# Patient Record
Sex: Female | Born: 1990 | Race: Black or African American | Hispanic: No | Marital: Married | Smoking: Current every day smoker
Health system: Southern US, Community
[De-identification: ages and names within clinical notes are randomized; demographics above are authoritative.]

## PROBLEM LIST (undated history)

## (undated) DIAGNOSIS — E119 Type 2 diabetes mellitus without complications: Secondary | ICD-10-CM

## (undated) HISTORY — PX: TUBAL LIGATION: SHX77

## (undated) HISTORY — PX: FOOT SURGERY: SHX648

---

## 2017-01-01 ENCOUNTER — Encounter (HOSPITAL_BASED_OUTPATIENT_CLINIC_OR_DEPARTMENT_OTHER): Payer: Self-pay

## 2017-01-01 ENCOUNTER — Emergency Department (HOSPITAL_BASED_OUTPATIENT_CLINIC_OR_DEPARTMENT_OTHER)
Admission: EM | Admit: 2017-01-01 | Discharge: 2017-01-01 | Disposition: A | Discharge status: Home / Self Care | Payer: Medicaid Other | Attending: Emergency Medicine | Admitting: Emergency Medicine

## 2017-01-01 DIAGNOSIS — K649 Unspecified hemorrhoids: Secondary | ICD-10-CM | POA: Diagnosis not present

## 2017-01-01 DIAGNOSIS — K625 Hemorrhage of anus and rectum: Secondary | ICD-10-CM | POA: Diagnosis present

## 2017-01-01 DIAGNOSIS — F17200 Nicotine dependence, unspecified, uncomplicated: Secondary | ICD-10-CM | POA: Insufficient documentation

## 2017-01-01 LAB — OCCULT BLOOD X 1 CARD TO LAB, STOOL: FECAL OCCULT BLD: POSITIVE — AB

## 2017-01-01 MED ORDER — STARCH 51 % RE SUPP: 1.0000 | RECTAL | 0 refills | Status: DC | PRN

## 2017-01-01 NOTE — ED Provider Notes (Signed)
[facility] Provider Note   CSN: [id number] Arrival date & time: 01/01/17  1445     History   Chief Complaint Chief Complaint  Patient presents with  . Rectal Bleeding    HPI Mary Hurley is a 26 y.o. female presenting with rectal bleeding that she first noticed today around noon. SHe felt he had to have a BM, went to bathroom and peed but could not have a BM- she denies straining. She wiped then noticed BRB in toilet and on tissue paper. She initially thought it was her period but her menstrual cycle ended yesterday. Again 4 hours later she felt like she had to have BM, decided to not pee this time and just see if there was any bleeding. Could not have BM, wiped to see if there was blood and saw bright red blood clots on tissue paper. She decided to come to ED. She is sexually active with her wife, has a BTL, denies any chance she is pregnant. Has not inserted anything into her rectum. No SOB, palpitations, fatigue, DOE. This has never happened to her before. She did have hemorrhoids in the past pregnant w/ her first child but has not noticed them since.   HPI  History reviewed. No pertinent past medical history.  There are no active problems to display for this patient.   Past Surgical History:  Procedure Laterality Date  . CESAREAN SECTION    . TUBAL LIGATION      OB History    No data available       Home Medications    Prior to Admission medications   Not on File    Family History No family history on file.  Social History Social History  Substance Use Topics  . Smoking status: Current Every Day Smoker  . Smokeless tobacco: Never Used  . Alcohol use No     Allergies   Patient has no known allergies.   Review of Systems Review of Systems  Constitutional: Negative for activity change, appetite change, chills and fatigue.  HENT: Negative.   Respiratory: Negative for shortness of breath.   Cardiovascular: Negative for chest pain and  palpitations.  Gastrointestinal: Positive for anal bleeding. Negative for abdominal pain, blood in stool, constipation, diarrhea, nausea, rectal pain and vomiting.  Genitourinary: Negative for dysuria, vaginal bleeding and vaginal pain.  Musculoskeletal: Negative for gait problem and myalgias.  Skin: Negative for pallor, rash and wound.  Neurological: Negative for dizziness, weakness, light-headedness and headaches.     Physical Exam Updated Vital Signs BP 133/84 (BP Location: Left Arm)   Pulse 88   Temp 99 F (37.2 C) (Oral)   Resp 18   Ht  (1.6 m)   Wt 91.5 kg (201 lb 12.8 oz)   LMP 12/25/2016   SpO2 99%   BMI 35.75 kg/m   Physical Exam  Constitutional: She is oriented to person, place, and time. She appears well-developed and well-nourished. No distress.  HENT:  Head: Normocephalic and atraumatic.  Eyes: Pupils are equal, round, and reactive to light. Conjunctivae and EOM are normal.  Neck: Normal range of motion. Neck supple.  Cardiovascular: Normal rate and regular rhythm.   No murmur heard. Pulmonary/Chest: Effort normal and breath sounds normal. No respiratory distress.  Genitourinary: Vagina normal. Rectal exam shows external hemorrhoid, internal hemorrhoid and guaiac positive stool. Rectal exam shows no fissure, no mass and anal tone normal. No bleeding in the vagina. No vaginal discharge found.  Musculoskeletal: Normal range  of motion. She exhibits no tenderness.  Neurological: She is alert and oriented to person, place, and time. She exhibits normal muscle tone.  Skin: Skin is warm and dry. No pallor.  Psychiatric: She has a normal mood and affect. Her behavior is normal. Judgment and thought content normal.   ED Treatments / Results  Labs (all labs ordered are listed, but only abnormal results are displayed) Labs Reviewed  OCCULT BLOOD X 1 CARD TO LAB, STOOL - Abnormal; Notable for the following:       Result Value   Fecal Occult Bld POSITIVE (*)    All  other components within normal limits    EKG  EKG Interpretation None       Radiology No results found.  Procedures Procedures (including critical care time)  Medications Ordered in ED Medications - No data to display   Initial Impression / Assessment and Plan / ED Course  I have reviewed the triage vital signs and the nursing notes.  Pertinent labs & imaging results that were available during my care of the patient were reviewed by me and considered in my medical decision making (see chart for details).     Well appearing 26 year old female presenting with BRB per rectum that began today. She is asymptomatic, no rectal pain. No fissure appreciated on exam but with external hemorrhoids. Tender on rectal exam, no masses appreciated. FOBT positive. No vaginal bleeding appreciated. Likely hemorrhoid bleeding. Advised increasing fiber in diet. Handout given. Patient stable for discharge home. Patient verbalized understanding and agreement with plan.   Final Clinical Impressions(s) / ED Diagnoses   Final diagnoses:  Hemorrhoids, unspecified hemorrhoid type    New Prescriptions New Prescriptions   No medications on file   Dolores Patty, DO PGY-2, [facility] 01/01/2017 3:59 PM    Tillman Sers, DO 01/01/17 1559    Rolland Porter, MD 01/13/17 2213

## 2017-01-01 NOTE — ED Triage Notes (Signed)
C/o rectal bleeding with blood in toilet and on tissue x 2 today-NAD-steady gait

## 2017-01-01 NOTE — ED Notes (Signed)
ED Provider at bedside. 

## 2017-09-15 ENCOUNTER — Encounter (HOSPITAL_BASED_OUTPATIENT_CLINIC_OR_DEPARTMENT_OTHER): Payer: Self-pay | Admitting: *Deleted

## 2017-09-15 ENCOUNTER — Emergency Department (HOSPITAL_BASED_OUTPATIENT_CLINIC_OR_DEPARTMENT_OTHER): Payer: Self-pay

## 2017-09-15 ENCOUNTER — Emergency Department (HOSPITAL_BASED_OUTPATIENT_CLINIC_OR_DEPARTMENT_OTHER)
Admission: EM | Admit: 2017-09-15 | Discharge: 2017-09-15 | Disposition: A | Discharge status: Home / Self Care | Payer: Self-pay | Attending: Emergency Medicine | Admitting: Emergency Medicine

## 2017-09-15 ENCOUNTER — Other Ambulatory Visit: Payer: Self-pay

## 2017-09-15 DIAGNOSIS — S93401A Sprain of unspecified ligament of right ankle, initial encounter: Secondary | ICD-10-CM | POA: Insufficient documentation

## 2017-09-15 DIAGNOSIS — Y9289 Other specified places as the place of occurrence of the external cause: Secondary | ICD-10-CM | POA: Insufficient documentation

## 2017-09-15 DIAGNOSIS — Y939 Activity, unspecified: Secondary | ICD-10-CM | POA: Insufficient documentation

## 2017-09-15 DIAGNOSIS — X501XXA Overexertion from prolonged static or awkward postures, initial encounter: Secondary | ICD-10-CM | POA: Insufficient documentation

## 2017-09-15 DIAGNOSIS — Y999 Unspecified external cause status: Secondary | ICD-10-CM | POA: Insufficient documentation

## 2017-09-15 NOTE — Discharge Instructions (Signed)
Please read and follow all provided instructions.  Your diagnoses today include:  1. Sprain of right ankle, unspecified ligament, initial encounter     Tests performed today include:  An x-ray of your ankle - does NOT show any broken bones  Vital signs. See below for your results today.   Medications prescribed:   None  Take any prescribed medications only as directed.  Home care instructions:   Follow any educational materials contained in this packet  Follow R.I.C.E. Protocol:  R - rest your injury   I  - use ice on injury without applying directly to skin  C - compress injury with bandage or splint  E - elevate the injury as much as possible  Follow-up instructions: Please follow-up with your primary care provider or the provided orthopedic (bone specialist) if you continue to have significant pain or trouble walking in 1 week. In this case you may have a severe sprain that requires further care.   Return instructions:   Please return if your toes are numb or tingling, appear gray or blue, or you have severe pain (also elevate leg and loosen splint or wrap)  Please return to the Emergency Department if you experience worsening symptoms.   Please return if you have any other emergent concerns.  Additional Information:  Your vital signs today were: BP (!) 147/84 (BP Location: Left Arm)    Pulse (!) 102    Temp 98.5 F (36.9 C) (Oral)    Resp 20    Ht 5' 3.75" (1.619 m)    Wt 91.2 kg (201 lb)    LMP 08/28/2017    SpO2 99%    BMI 34.77 kg/m  If your blood pressure (BP) was elevated above 135/85 this visit, please have this repeated by your doctor within one month. -------------- Your caregiver has diagnosed you as suffering from an ankle sprain. Ankle sprain occurs when the ligaments that hold the ankle joint together are stretched or torn. It may take 4 to 6 weeks to heal.  For Activity: If prescribed crutches, use crutches with non-weight bearing for the first  few days. Then, you may walk on your ankle as the pain allows, or as instructed. Start gradually with weight bearing on the affected ankle. Once you can walk pain free, then try jogging. When you can run forwards, then you can try moving side-to-side. If you cannot walk without crutches in one week, you need a re-check. --------------

## 2017-09-15 NOTE — ED Triage Notes (Signed)
Twisted her right ankle at work today.

## 2017-09-15 NOTE — ED Provider Notes (Signed)
[facility] EMERGENCY DEPARTMENT Provider Note   CSN: [id number] Arrival date & time: 09/15/17  2010     History   Chief Complaint Chief Complaint  Patient presents with  . Ankle Injury    HPI Mary Hurley is a 27 y.o. female.  Patient presents to the emergency department with acute onset of right ankle pain when she twisted it at work today.  She denies other injury.  She tried to use a splint for her ankle but cannot tolerate it.  Pain is worse with bearing weight.  She has no knee or hip pain.  No numbness or tingling.  Ice has helped her pain and swelling.     History reviewed. No pertinent past medical history.  There are no active problems to display for this patient.   Past Surgical History:  Procedure Laterality Date  . CESAREAN SECTION    . TUBAL LIGATION       OB History   None      Home Medications    Prior to Admission medications   Medication Sig Start Date End Date Taking? Authorizing Provider  starch (ANUSOL) 51 % suppository Place 1 suppository rectally as needed for pain. 01/01/17   Rolland PorterJames, Mark, MD    Family History No family history on file.  Social History Social History   Tobacco Use  . Smoking status: Current Every Day Smoker  . Smokeless tobacco: Never Used  Substance Use Topics  . Alcohol use: No  . Drug use: No     Allergies   Patient has no known allergies.   Review of Systems Review of Systems  Constitutional: Negative for activity change.  Musculoskeletal: Positive for arthralgias, gait problem and joint swelling. Negative for back pain and neck pain.  Skin: Negative for wound.  Neurological: Negative for weakness and numbness.     Physical Exam Updated Vital Signs BP (!) 147/84 (BP Location: Left Arm)   Pulse (!) 102   Temp 98.5 F (36.9 C) (Oral)   Resp 20   Ht 5' 3.75" (1.619 m)   Wt 91.2 kg (201 lb)   LMP 08/28/2017   SpO2 99%   BMI 34.77 kg/m   Physical Exam  Constitutional: She appears  well-developed and well-nourished.  HENT:  Head: Normocephalic and atraumatic.  Eyes: Conjunctivae are normal.  Neck: Normal range of motion. Neck supple.  Cardiovascular:  Pulses:      Dorsalis pedis pulses are 2+ on the right side, and 2+ on the left side.       Posterior tibial pulses are 2+ on the right side, and 2+ on the left side.  Musculoskeletal: She exhibits edema and tenderness.       Right knee: Normal.       Right ankle: She exhibits decreased range of motion and swelling. Tenderness. Lateral malleolus and medial malleolus tenderness found. Achilles tendon exhibits no pain.       Right lower leg: Normal. She exhibits no tenderness, no bony tenderness and no swelling.       Right foot: Normal. There is normal range of motion and no tenderness.  Neurological: She is alert.  Distal motor, sensation, and vascular intact.   Skin: Skin is warm and dry.  Psychiatric: She has a normal mood and affect.  Nursing note and vitals reviewed.    ED Treatments / Results  Labs (all labs ordered are listed, but only abnormal results are displayed) Labs Reviewed - No data to display  EKG None  Radiology Dg Ankle Complete Right  Result Date: 09/15/2017 CLINICAL DATA:  Twisting injury with diffuse pain. EXAM: RIGHT ANKLE - COMPLETE 3+ VIEW COMPARISON:  None. FINDINGS: There may be minimal lateral malleolar soft tissue swelling. No acute fracture or dislocation. Base of fifth metatarsal and talar dome intact. IMPRESSION: No acute osseous abnormality. Electronically Signed   By: Jeronimo Greaves M.D.   On: 09/15/2017 20:48    Procedures Procedures (including critical care time)  Medications Ordered in ED Medications - No data to display   Initial Impression / Assessment and Plan / ED Course  I have reviewed the triage vital signs and the nursing notes.  Pertinent labs & imaging results that were available during my care of the patient were reviewed by me and considered in my medical  decision making (see chart for details).     Patient seen and examined.  Patient informed of x-ray results.  Ace wrap and crutches ordered.  Vital signs reviewed and are as follows: BP (!) 147/84 (BP Location: Left Arm)   Pulse (!) 102   Temp 98.5 F (36.9 C) (Oral)   Resp 20   Ht 5' 3.75" (1.619 m)   Wt 91.2 kg (201 lb)   LMP 08/28/2017   SpO2 99%   BMI 34.77 kg/m   Patient was counseled on RICE protocol and told to rest injury, use ice for no longer than 15 minutes every hour, compress the area, and elevate above the level of their heart as much as possible to reduce swelling. Questions answered. Patient verbalized understanding.    Encouraged PCP or sports medicine follow-up in 1 week if not improving.   Final Clinical Impressions(s) / ED Diagnoses   Final diagnoses:  Sprain of right ankle, unspecified ligament, initial encounter   Patient with ankle sprain, negative imaging.  Foot is neurovascularly intact.  No proximal fibula pain.  ED Discharge Orders    None       Renne Crigler, Cordelia Poche 09/15/17 2129    Maia Plan, MD 09/16/17 1004

## 2017-10-15 ENCOUNTER — Other Ambulatory Visit: Payer: Self-pay

## 2017-10-15 ENCOUNTER — Emergency Department (HOSPITAL_BASED_OUTPATIENT_CLINIC_OR_DEPARTMENT_OTHER)
Admission: EM | Admit: 2017-10-15 | Discharge: 2017-10-15 | Disposition: A | Discharge status: Home / Self Care | Payer: Medicaid Other | Attending: Emergency Medicine | Admitting: Emergency Medicine

## 2017-10-15 ENCOUNTER — Encounter (HOSPITAL_BASED_OUTPATIENT_CLINIC_OR_DEPARTMENT_OTHER): Payer: Self-pay

## 2017-10-15 DIAGNOSIS — F17200 Nicotine dependence, unspecified, uncomplicated: Secondary | ICD-10-CM | POA: Insufficient documentation

## 2017-10-15 DIAGNOSIS — R51 Headache: Secondary | ICD-10-CM | POA: Diagnosis not present

## 2017-10-15 DIAGNOSIS — J020 Streptococcal pharyngitis: Secondary | ICD-10-CM | POA: Diagnosis not present

## 2017-10-15 DIAGNOSIS — R070 Pain in throat: Secondary | ICD-10-CM | POA: Diagnosis present

## 2017-10-15 DIAGNOSIS — R509 Fever, unspecified: Secondary | ICD-10-CM | POA: Insufficient documentation

## 2017-10-15 LAB — RAPID STREP SCREEN (MED CTR MEBANE ONLY): Streptococcus, Group A Screen (Direct): POSITIVE — AB

## 2017-10-15 MED ORDER — IBUPROFEN 100 MG/5ML PO SUSP
600.0000 mg | Freq: Once | ORAL | Status: AC
  Administered 2017-10-15: 600 mg via ORAL

## 2017-10-15 MED ORDER — ACETAMINOPHEN 160 MG/5ML PO SOLN
650.0000 mg | Freq: Once | ORAL | Status: AC
  Administered 2017-10-15: 650 mg via ORAL

## 2017-10-15 MED ORDER — IBUPROFEN 400 MG PO TABS: 600.0000 mg | ORAL_TABLET | Freq: Once | ORAL | Status: DC

## 2017-10-15 MED ORDER — ACETAMINOPHEN 160 MG/5ML PO SOLN
ORAL | Status: AC
  Filled 2017-10-15: qty 20.3

## 2017-10-15 MED ORDER — IBUPROFEN 100 MG/5ML PO SUSP
ORAL | Status: AC
  Filled 2017-10-15: qty 30

## 2017-10-15 MED ORDER — PENICILLIN G BENZATHINE 1200000 UNIT/2ML IM SUSP
1.2000 10*6.[IU] | Freq: Once | INTRAMUSCULAR | Status: AC
  Administered 2017-10-15: 1.2 10*6.[IU] via INTRAMUSCULAR
  Filled 2017-10-15: qty 2

## 2017-10-15 NOTE — Discharge Instructions (Signed)
You may take over-the-counter ibuprofen and Tylenol as needed for pain.

## 2017-10-15 NOTE — ED Provider Notes (Signed)
[facility] EMERGENCY DEPARTMENT Provider Note   CSN: [id number] Arrival date & time: 10/15/17  1901     History   Chief Complaint Chief Complaint  Patient presents with  . Sore Throat    HPI [name] is a 27 y.o. female.  HPI Patient presents with 3 days of sore throat, subjective fevers and chills, headache.  No voice changes.  No nausea or vomiting.  Denies abdominal pain. History reviewed. No pertinent past medical history.  There are no active problems to display for this patient.   Past Surgical History:  Procedure Laterality Date  . CESAREAN SECTION    . TUBAL LIGATION       OB History   None      Home Medications    Prior to Admission medications   Medication Sig Start Date End Date Taking? Authorizing Provider  starch (ANUSOL) 51 % suppository Place 1 suppository rectally as needed for pain. 01/01/17   Rolland PorterJames, Mark, MD    Family History No family history on file.  Social History Social History   Tobacco Use  . Smoking status: Current Every Day Smoker  . Smokeless tobacco: Never Used  Substance Use Topics  . Alcohol use: No  . Drug use: No     Allergies   Patient has no known allergies.   Review of Systems Review of Systems  Constitutional: Positive for chills, fatigue and fever.  HENT: Positive for sore throat. Negative for congestion, sinus pressure, sinus pain, trouble swallowing and voice change.   Eyes: Negative for visual disturbance.  Respiratory: Negative for cough and shortness of breath.   Cardiovascular: Negative for chest pain, palpitations and leg swelling.  Gastrointestinal: Negative for abdominal pain, diarrhea, nausea and vomiting.  Genitourinary: Negative for flank pain, frequency and hematuria.  Musculoskeletal: Positive for myalgias and neck pain. Negative for arthralgias, back pain and neck stiffness.  Skin: Negative for rash and wound.  Neurological: Positive for headaches. Negative for dizziness,  weakness, light-headedness and numbness.  All other systems reviewed and are negative.    Physical Exam Updated Vital Signs BP 122/88 (BP Location: Left Arm)   Pulse (!) 106   Temp 99.1 F (37.3 C) (Oral)   Resp 20   Ht 5\' 4"  (1.626 m)   Wt 89.8 kg (198 lb)   LMP 09/23/2017   SpO2 98%   BMI 33.99 kg/m   Physical Exam  Constitutional: She is oriented to person, place, and time. She appears well-developed and well-nourished. No distress.  HENT:  Head: Normocephalic and atraumatic.  Mouth/Throat: Oropharyngeal exudate present.  Bilateral tonsillar hypertrophy with erythema and exudates.  Uvula is midline.  Eyes: Pupils are equal, round, and reactive to light. EOM are normal.  Neck: Normal range of motion. Neck supple.  No meningismus.  Anterior cervical lymphadenopathy.  Cardiovascular: Normal rate and regular rhythm.  Pulmonary/Chest: Effort normal and breath sounds normal.  Abdominal: Soft. Bowel sounds are normal. There is no tenderness. There is no rebound and no guarding.  Musculoskeletal: Normal range of motion. She exhibits no edema or tenderness.  Lymphadenopathy:    She has cervical adenopathy.  Neurological: She is alert and oriented to person, place, and time.  Skin: Skin is warm and dry. Capillary refill takes less than 2 seconds. No rash noted. She is not diaphoretic. No erythema.  Psychiatric: She has a normal mood and affect. Her behavior is normal.  Nursing note and vitals reviewed.    ED Treatments / Results  Labs (  all labs ordered are listed, but only abnormal results are displayed) Labs Reviewed  RAPID STREP SCREEN ([facility] [place] ONLY) - Abnormal; Notable for the following components:      Result Value   Streptococcus, Group A Screen (Direct) POSITIVE (*)    All other components within normal limits    EKG None  Radiology No results found.  Procedures Procedures (including critical care time)  Medications Ordered in ED Medications    acetaminophen (TYLENOL) 160 MG/5ML solution (has no administration in time range)  ibuprofen (ADVIL,MOTRIN) 100 MG/5ML suspension (has no administration in time range)  acetaminophen (TYLENOL) solution 650 mg (650 mg Oral Given 10/15/17 1931)  penicillin g benzathine (BICILLIN LA) 1200000 UNIT/2ML injection 1.2 Million Units (1.2 Million Units Intramuscular Given 10/15/17 2007)  ibuprofen (ADVIL,MOTRIN) 100 MG/5ML suspension 600 mg (600 mg Oral Given 10/15/17 2007)     Initial Impression / Assessment and Plan / ED Course  I have reviewed the triage vital signs and the nursing notes.  Pertinent labs & imaging results that were available during my care of the patient were reviewed by me and considered in my medical decision making (see chart for details).     Given IM penicillin and will discharge home with ibuprofen.  Return precautions have been given.  Final Clinical Impressions(s) / ED Diagnoses   Final diagnoses:  Strep pharyngitis    ED Discharge Orders    None       Loren Racer, MD 10/15/17 2015

## 2017-10-15 NOTE — ED Notes (Signed)
ED Provider at bedside. 

## 2017-10-15 NOTE — ED Triage Notes (Signed)
C/o sore throat, fever x 3 days-NAD-steady gait-states she needs RTW note-NAD-steady gait

## 2018-01-28 ENCOUNTER — Emergency Department (HOSPITAL_BASED_OUTPATIENT_CLINIC_OR_DEPARTMENT_OTHER)
Admission: EM | Admit: 2018-01-28 | Discharge: 2018-01-28 | Disposition: A | Discharge status: Home / Self Care | Payer: Medicaid Other | Attending: Emergency Medicine | Admitting: Emergency Medicine

## 2018-01-28 ENCOUNTER — Other Ambulatory Visit: Payer: Self-pay

## 2018-01-28 ENCOUNTER — Encounter (HOSPITAL_BASED_OUTPATIENT_CLINIC_OR_DEPARTMENT_OTHER): Payer: Self-pay

## 2018-01-28 DIAGNOSIS — J029 Acute pharyngitis, unspecified: Secondary | ICD-10-CM | POA: Insufficient documentation

## 2018-01-28 DIAGNOSIS — F17200 Nicotine dependence, unspecified, uncomplicated: Secondary | ICD-10-CM | POA: Diagnosis not present

## 2018-01-28 MED ORDER — PENICILLIN G BENZATHINE & PROC 1200000 UNIT/2ML IM SUSP
1.2000 10*6.[IU] | Freq: Once | INTRAMUSCULAR | Status: AC
  Administered 2018-01-28: 1.2 10*6.[IU] via INTRAMUSCULAR
  Filled 2018-01-28: qty 2

## 2018-01-28 MED ORDER — ACETAMINOPHEN 500 MG PO TABS
1000.0000 mg | ORAL_TABLET | Freq: Once | ORAL | Status: AC
  Administered 2018-01-28: 1000 mg via ORAL
  Filled 2018-01-28: qty 2

## 2018-01-28 NOTE — ED Provider Notes (Signed)
[facility] EMERGENCY DEPARTMENT Provider Note   CSN: [id number] Arrival date & time: 01/28/18  1301     History   Chief Complaint Chief Complaint  Patient presents with  . URI    HPI Mary Hurley is a 27 y.o. female comes for evaluation of 3 days of sore throat.  She reports she took 1 dose of ibuprofen today with minimal improvement in symptoms.  She states she has been able to tolerate her secretions but does report some worsening pain with swelling.  She has not had any vomiting or difficulty breathing.  Patient states that she is also had a lot of nasal congestion sinus pressure.  She reports that just prior to arrival, she started developing headache.  Patient reports that she had subjective fevers at home but did not measure her temperature.  She reports sick contacts at work.  Patient denies any cough, abdominal pain, difficulty tolerating p.o.  The history is provided by the patient.    History reviewed. No pertinent past medical history.  There are no active problems to display for this patient.   Past Surgical History:  Procedure Laterality Date  . CESAREAN SECTION    . TUBAL LIGATION       OB History   None      Home Medications    Prior to Admission medications   Medication Sig Start Date End Date Taking? Authorizing Provider  starch (ANUSOL) 51 % suppository Place 1 suppository rectally as needed for pain. 01/01/17   Rolland Porter, MD    Family History No family history on file.  Social History Social History   Tobacco Use  . Smoking status: Current Every Day Smoker  . Smokeless tobacco: Never Used  Substance Use Topics  . Alcohol use: No  . Drug use: No     Allergies   Patient has no known allergies.   Review of Systems Review of Systems  Constitutional: Positive for fever (Subjective).  HENT: Positive for congestion, sinus pressure and sore throat. Negative for drooling and trouble swallowing.   Respiratory: Negative for  cough and shortness of breath.   Cardiovascular: Negative for chest pain.  Gastrointestinal: Negative for vomiting.  Genitourinary: Negative for dysuria.  All other systems reviewed and are negative.    Physical Exam Updated Vital Signs BP 129/83 (BP Location: Left Arm)   Pulse (!) 112   Temp 98.7 F (37.1 C) (Oral)   Resp 20   Ht 5\' 3"  (1.6 m)   Wt 84.8 kg   LMP 01/25/2018   SpO2 98%   BMI 33.13 kg/m   Physical Exam  Constitutional: She is oriented to person, place, and time. She appears well-developed and well-nourished.  HENT:  Head: Normocephalic and atraumatic.  Mouth/Throat: Uvula is midline and mucous membranes are normal. No trismus in the jaw. Posterior oropharyngeal erythema present. Tonsillar exudate.  No midline.  No trismus.  Posterior oropharynx is erythematous with tonsillar exudates.  No evidence of peritonsillar swelling.  Airways patent, phonation is intact.  Eyes: Pupils are equal, round, and reactive to light. Conjunctivae, EOM and lids are normal.  Neck: Full passive range of motion without pain.  Cardiovascular: Normal rate, regular rhythm, normal heart sounds and normal pulses.  Pulmonary/Chest: Effort normal and breath sounds normal.  Musculoskeletal: Normal range of motion.  Lymphadenopathy:    She has cervical adenopathy.       Left cervical: Superficial cervical adenopathy present.  Neurological: She is alert and oriented to person, place,  and time.  Skin: Skin is warm and dry. Capillary refill takes less than 2 seconds.  Psychiatric: She has a normal mood and affect. Her speech is normal.  Nursing note and vitals reviewed.    ED Treatments / Results  Labs (all labs ordered are listed, but only abnormal results are displayed) Labs Reviewed - No data to display  EKG None  Radiology No results found.  Procedures Procedures (including critical care time)  Medications Ordered in ED Medications  acetaminophen (TYLENOL) tablet 1,000 mg  (1,000 mg Oral Given 01/28/18 1424)  penicillin g procaine-penicillin g benzathine (BICILLIN-CR) injection 600000-600000 units (1.2 Million Units Intramuscular Given 01/28/18 1426)     Initial Impression / Assessment and Plan / ED Course  I have reviewed the triage vital signs and the nursing notes.  Pertinent labs & imaging results that were available during my care of the patient were reviewed by me and considered in my medical decision making (see chart for details).     27 year old female who presents for evaluation of 3 days of sore throat.  Reports subjective fevers at home.  No difficulty tolerating secretions, vomiting but does report worsening pain with swallowing.  No cough noted. Patient is afebrile, non-toxic appearing, sitting comfortably on examination table. Vital signs reviewed and stable.  On exam, posterior oropharynx is erythematous with tonsillar exudates left, greater than right.  Uvula is midline.  No trismus.  Patient also with cervical lymphadenopathy.  Exam is concerning for strep pharyngitis.  Patient has a Centor score of 3.  We will go ahead and treat for strep pharyngitis.  History/physical exam is not concerning for peritonsillar abscess, Ludwig angina.  Discussed treatment options with patient.  She would prefer penicillin here in ED. Patient with no known drug allergies. Patient had ample opportunity for questions and discussion. All patient's questions were answered with full understanding. Strict return precautions discussed. Patient expresses understanding and agreement to plan.   Final Clinical Impressions(s) / ED Diagnoses   Final diagnoses:  Pharyngitis, unspecified etiology    ED Discharge Orders    None       Maxwell Caul, PA-C 01/28/18 1612    Arby Barrette, MD 02/06/18 1558

## 2018-01-28 NOTE — Discharge Instructions (Signed)
You can take Tylenol or Ibuprofen as directed for pain. You can alternate Tylenol and Ibuprofen every 4 hours. If you take Tylenol at 1pm, then you can take Ibuprofen at 5pm. Then you can take Tylenol again at 9pm.   Make sure you are drinking plenty of fluids and stay hydrated.  Follow-up with your primary care doctor or the referred [facility] for further evaluation or establishing primary care doctor.  Return the emergency department for any worsening sore throat, inability to swallow your saliva, vomiting, difficulty breathing, fever despite medications or any other worsening or concerning symptoms.

## 2018-01-28 NOTE — ED Triage Notes (Signed)
C/o URI sx x 3 days-NAD-steady gait 

## 2018-03-26 ENCOUNTER — Emergency Department (HOSPITAL_BASED_OUTPATIENT_CLINIC_OR_DEPARTMENT_OTHER): Payer: Medicaid Other

## 2018-03-26 ENCOUNTER — Encounter (HOSPITAL_BASED_OUTPATIENT_CLINIC_OR_DEPARTMENT_OTHER): Payer: Self-pay | Admitting: *Deleted

## 2018-03-26 ENCOUNTER — Emergency Department (HOSPITAL_BASED_OUTPATIENT_CLINIC_OR_DEPARTMENT_OTHER)
Admission: EM | Admit: 2018-03-26 | Discharge: 2018-03-26 | Disposition: A | Discharge status: Home / Self Care | Payer: Medicaid Other | Attending: Emergency Medicine | Admitting: Emergency Medicine

## 2018-03-26 ENCOUNTER — Other Ambulatory Visit: Payer: Self-pay

## 2018-03-26 DIAGNOSIS — W230XXA Caught, crushed, jammed, or pinched between moving objects, initial encounter: Secondary | ICD-10-CM | POA: Insufficient documentation

## 2018-03-26 DIAGNOSIS — Y999 Unspecified external cause status: Secondary | ICD-10-CM | POA: Insufficient documentation

## 2018-03-26 DIAGNOSIS — Y939 Activity, unspecified: Secondary | ICD-10-CM | POA: Diagnosis not present

## 2018-03-26 DIAGNOSIS — S67193A Crushing injury of left middle finger, initial encounter: Secondary | ICD-10-CM | POA: Diagnosis present

## 2018-03-26 DIAGNOSIS — F17210 Nicotine dependence, cigarettes, uncomplicated: Secondary | ICD-10-CM | POA: Insufficient documentation

## 2018-03-26 DIAGNOSIS — S6992XA Unspecified injury of left wrist, hand and finger(s), initial encounter: Secondary | ICD-10-CM

## 2018-03-26 DIAGNOSIS — Y92810 Car as the place of occurrence of the external cause: Secondary | ICD-10-CM | POA: Insufficient documentation

## 2018-03-26 MED ORDER — HYDROCODONE-ACETAMINOPHEN 5-325 MG PO TABS
1.0000 | ORAL_TABLET | Freq: Once | ORAL | Status: AC
  Administered 2018-03-26: 1 via ORAL
  Filled 2018-03-26: qty 1

## 2018-03-26 NOTE — ED Triage Notes (Signed)
She shut her hand in a car door. Pain in her left middle finger. States it is the same finger she broke in November.

## 2018-03-26 NOTE — ED Notes (Signed)
Pt. Reports she injured this same finger / broke this same L middle finger back in Nov.  With inability to return to work due to not being able to use both hands.

## 2018-03-26 NOTE — ED Provider Notes (Signed)
[facility] EMERGENCY DEPARTMENT Provider Note   CSN: [id number] Arrival date & time: 03/26/18  2121     History   Chief Complaint Chief Complaint  Patient presents with  . Finger Injury    HPI Mary Hurley is a 27 y.o. female.  The history is provided by the patient and medical records. No language interpreter was used.   Mary Hurley is a 27 y.o. female who presents to ER after slamming middle long left finger in the car door today. Injured this same finger about a month ago - told finger was broken. Followed by Dr. Aundria Rudogers of orthopedics. Has been going to PT for this. Pain to the same area. Worse with certain movements. No numbness, weakness or open wounds.   History reviewed. No pertinent past medical history.  There are no active problems to display for this patient.   Past Surgical History:  Procedure Laterality Date  . CESAREAN SECTION    . TUBAL LIGATION       OB History   No obstetric history on file.      Home Medications    Prior to Admission medications   Medication Sig Start Date End Date Taking? Authorizing Provider  starch (ANUSOL) 51 % suppository Place 1 suppository rectally as needed for pain. 01/01/17   Rolland PorterJames, Mark, MD    Family History No family history on file.  Social History Social History   Tobacco Use  . Smoking status: Current Every Day Smoker  . Smokeless tobacco: Never Used  Substance Use Topics  . Alcohol use: No  . Drug use: No     Allergies   Patient has no known allergies.   Review of Systems Review of Systems  Musculoskeletal: Positive for arthralgias.  Skin: Negative for color change and wound.  Neurological: Negative for weakness and numbness.     Physical Exam Updated Vital Signs BP (!) 144/89 (BP Location: Left Arm)   Pulse 71   Temp 98.5 F (36.9 C) (Oral)   Resp 18   Ht 5\' 3"  (1.6 m)   Wt 83 kg   LMP 02/23/2018   SpO2 100%   BMI 32.42 kg/m   Physical Exam Vitals signs and  nursing note reviewed.  Constitutional:      General: She is not in acute distress.    Appearance: She is well-developed.  HENT:     Head: Normocephalic and atraumatic.  Neck:     Musculoskeletal: Neck supple.  Cardiovascular:     Rate and Rhythm: Normal rate and regular rhythm.     Heart sounds: Normal heart sounds. No murmur.  Pulmonary:     Effort: Pulmonary effort is normal. No respiratory distress.     Breath sounds: Normal breath sounds. No wheezing or rales.  Musculoskeletal: Normal range of motion.     Comments: Tenderness to palpation of left middle finger DIP. No open wounds. Pain with movement of DIP joint. 2+ radial pulse. Sensation intact.   Skin:    General: Skin is warm and dry.  Neurological:     Mental Status: She is alert.      ED Treatments / Results  Labs (all labs ordered are listed, but only abnormal results are displayed) Labs Reviewed - No data to display  EKG None  Radiology Dg Hand Complete Left  Result Date: 03/26/2018 CLINICAL DATA:  Initial evaluation for acute pain status post trauma. EXAM: LEFT HAND - COMPLETE 3+ VIEW COMPARISON:  None. FINDINGS: Fracture seen extending through the  distal tuft of the left third distal phalanx, likely subacute in nature. Minimal displacement. No other acute fracture or dislocation. Joint spaces maintained. No appreciable soft tissue injury. IMPRESSION: 1. Minimally displaced fracture extending through the distal tuft of the left third distal phalanx. Finding favored to be subacute in nature. Correlation with history and physical exam recommended. 2. No other acute osseous abnormality about the hand. Electronically Signed   By: Rise Mu M.D.   On: 03/26/2018 22:16    Procedures Procedures (including critical care time)  Medications Ordered in ED Medications  HYDROcodone-acetaminophen (NORCO/VICODIN) 5-325 MG per tablet 1 tablet (1 tablet Oral Given 03/26/18 2246)     Initial Impression /  Assessment and Plan / ED Course  I have reviewed the triage vital signs and the nursing notes.  Pertinent labs & imaging results that were available during my care of the patient were reviewed by me and considered in my medical decision making (see chart for details).    [name] is a 27 y.o. female who presents to ED for evaluation after slamming finger in car door prior to arrival. NVI with no open wounds. X-ray obtained showing minimally displaced distal tuft fracture. Appears subacute per radiology. She did injury this same area last month. Possible that it is subacute, but given new injury, tenderness and pain, will place in finger splint and have her follow up with Dr. Aundria Rud who is managing her previous injury. Symptomatic home care instructions and return precautions discussed. All questions answered.   Final Clinical Impressions(s) / ED Diagnoses   Final diagnoses:  Injury of finger of left hand, initial encounter    ED Discharge Orders    None       Ward, Chase Picket, PA-C 03/26/18 2352    Little, Ambrose Finland, MD 03/28/18 1100

## 2018-03-26 NOTE — Discharge Instructions (Signed)
It was my pleasure taking care of you today!   Keep your finger in the splint provided in the emergency department tonight.  Please call Dr. Aundria Rudogers in the morning to let him know of your reinjury and schedule follow-up appointment.  Return to ER for new or worsening symptoms, any additional concerns.

## 2018-05-09 ENCOUNTER — Other Ambulatory Visit: Payer: Self-pay

## 2018-05-09 ENCOUNTER — Encounter (HOSPITAL_BASED_OUTPATIENT_CLINIC_OR_DEPARTMENT_OTHER): Payer: Self-pay | Admitting: Emergency Medicine

## 2018-05-09 ENCOUNTER — Emergency Department (HOSPITAL_BASED_OUTPATIENT_CLINIC_OR_DEPARTMENT_OTHER)
Admission: EM | Admit: 2018-05-09 | Discharge: 2018-05-09 | Disposition: A | Discharge status: Home / Self Care | Payer: Medicaid Other | Attending: Emergency Medicine | Admitting: Emergency Medicine

## 2018-05-09 DIAGNOSIS — F17210 Nicotine dependence, cigarettes, uncomplicated: Secondary | ICD-10-CM | POA: Insufficient documentation

## 2018-05-09 DIAGNOSIS — B354 Tinea corporis: Secondary | ICD-10-CM | POA: Insufficient documentation

## 2018-05-09 DIAGNOSIS — R21 Rash and other nonspecific skin eruption: Secondary | ICD-10-CM | POA: Diagnosis present

## 2018-05-09 MED ORDER — CLOTRIMAZOLE 1 % EX CREA: TOPICAL_CREAM | CUTANEOUS | 0 refills | Status: DC

## 2018-05-09 NOTE — ED Notes (Signed)
ED Provider at bedside. 

## 2018-05-09 NOTE — ED Triage Notes (Signed)
Pt states she has rash on right forearm that is itchy. Denies fevers or other symptoms

## 2018-05-09 NOTE — ED Notes (Addendum)
Rash with redness x 3 days reported by pt. Pt presents with rash and redness to right inner arm. Pt states she has been using A&D ointment for itching with no relief. Pt has broken skin r/t scratching the rash

## 2018-05-18 NOTE — ED Provider Notes (Signed)
[facility] EMERGENCY DEPARTMENT Provider Note   CSN: [id number] Arrival date & time: 05/09/18  1123     History   Chief Complaint Chief Complaint  Patient presents with  . Rash    HPI Mary Hurley is a 28 y.o. female.  HPI   28 year old female with rash.  Onset 3 days ago.  Itches.  Rashes to right upper extremity.  Her mother tried putting bleach on it which is made it burn.  She is also tried A&E ointment without improvement.  No respiratory complaints.  No GI complaints.  No dizziness or lightheadedness.  No new exposures that she is aware of.  History reviewed. No pertinent past medical history.  There are no active problems to display for this patient.   Past Surgical History:  Procedure Laterality Date  . CESAREAN SECTION    . TUBAL LIGATION       OB History   No obstetric history on file.      Home Medications    Prior to Admission medications   Medication Sig Start Date End Date Taking? Authorizing Provider  clotrimazole (LOTRIMIN) 1 % cream Apply to affected area 2 times daily 05/09/18   Raeford Razor, MD  starch (ANUSOL) 51 % suppository Place 1 suppository rectally as needed for pain. 01/01/17   Rolland Porter, MD    Family History No family history on file.  Social History Social History   Tobacco Use  . Smoking status: Current Every Day Smoker  . Smokeless tobacco: Never Used  Substance Use Topics  . Alcohol use: No  . Drug use: No     Allergies   Patient has no known allergies.   Review of Systems Review of Systems  All systems reviewed and negative, other than as noted in HPI.  Physical Exam Updated Vital Signs BP 114/86 (BP Location: Right Arm)   Pulse 96   Temp 98.1 F (36.7 C) (Oral)   Resp 20   Ht 5\' 3"  (1.6 m)   Wt 83 kg   SpO2 100%   BMI 32.42 kg/m   Physical Exam Vitals signs and nursing note reviewed.  Constitutional:      General: She is not in acute distress.    Appearance: She is well-developed.   HENT:     Head: Normocephalic and atraumatic.  Eyes:     General:        Right eye: No discharge.        Left eye: No discharge.     Conjunctiva/sclera: Conjunctivae normal.  Neck:     Musculoskeletal: Neck supple.  Cardiovascular:     Rate and Rhythm: Normal rate and regular rhythm.     Heart sounds: Normal heart sounds. No murmur. No friction rub. No gallop.   Pulmonary:     Effort: Pulmonary effort is normal. No respiratory distress.     Breath sounds: Normal breath sounds.  Abdominal:     General: There is no distension.     Palpations: Abdomen is soft.     Tenderness: There is no abdominal tenderness.  Musculoskeletal:        General: No tenderness.  Skin:    General: Skin is warm and dry.     Findings: Rash present.     Comments: Upper extremity there is approximately 3 cm circular to ovoid lesion.  Excoriated.  Slightly raised borders.  No drainage.  No abscess.  Neurological:     Mental Status: She is alert.  Psychiatric:  Behavior: Behavior normal.        Thought Content: Thought content normal.      ED Treatments / Results  Labs (all labs ordered are listed, but only abnormal results are displayed) Labs Reviewed - No data to display  EKG None  Radiology No results found.  Procedures Procedures (including critical care time)  Medications Ordered in ED Medications - No data to display   Initial Impression / Assessment and Plan / ED Course  I have reviewed the triage vital signs and the nursing notes.  Pertinent labs & imaging results that were available during my care of the patient were reviewed by me and considered in my medical decision making (see chart for details).     28 year old female with rash to her right arm.  I suspect this may be ringworm.  No other lesions.  No systemic symptoms.  Topical antifungals.  Return precautions were discussed. Final Clinical Impressions(s) / ED Diagnoses   Final diagnoses:  Tinea corporis    ED  Discharge Orders         Ordered    clotrimazole (LOTRIMIN) 1 % cream  Status:  Discontinued     05/09/18 1201    clotrimazole (LOTRIMIN) 1 % cream     05/09/18 1210           Raeford Razor, MD 05/18/18 1545

## 2018-05-26 ENCOUNTER — Encounter (HOSPITAL_BASED_OUTPATIENT_CLINIC_OR_DEPARTMENT_OTHER): Payer: Self-pay | Admitting: *Deleted

## 2018-05-26 ENCOUNTER — Emergency Department (HOSPITAL_BASED_OUTPATIENT_CLINIC_OR_DEPARTMENT_OTHER)
Admission: EM | Admit: 2018-05-26 | Discharge: 2018-05-26 | Disposition: A | Discharge status: Home / Self Care | Payer: Medicaid Other | Attending: Emergency Medicine | Admitting: Emergency Medicine

## 2018-05-26 ENCOUNTER — Other Ambulatory Visit: Payer: Self-pay

## 2018-05-26 DIAGNOSIS — L03311 Cellulitis of abdominal wall: Secondary | ICD-10-CM | POA: Insufficient documentation

## 2018-05-26 DIAGNOSIS — F17200 Nicotine dependence, unspecified, uncomplicated: Secondary | ICD-10-CM | POA: Diagnosis not present

## 2018-05-26 DIAGNOSIS — R222 Localized swelling, mass and lump, trunk: Secondary | ICD-10-CM | POA: Diagnosis present

## 2018-05-26 DIAGNOSIS — L02211 Cutaneous abscess of abdominal wall: Secondary | ICD-10-CM | POA: Diagnosis not present

## 2018-05-26 DIAGNOSIS — L0291 Cutaneous abscess, unspecified: Secondary | ICD-10-CM

## 2018-05-26 MED ORDER — DOXYCYCLINE HYCLATE 100 MG PO CAPS: 100.0000 mg | ORAL_CAPSULE | Freq: Two times a day (BID) | ORAL | 0 refills | Status: AC

## 2018-05-26 MED ORDER — LIDOCAINE-EPINEPHRINE (PF) 2 %-1:200000 IJ SOLN
INTRAMUSCULAR | Status: AC
  Administered 2018-05-26: 10 mL
  Filled 2018-05-26: qty 10

## 2018-05-26 NOTE — ED Notes (Signed)
ED Provider at bedside. 

## 2018-05-26 NOTE — ED Triage Notes (Signed)
Swelling, redness and pain to her left lower abdomen. States she thinks it is an abscess like she recently had on the center of her abdomen.

## 2018-05-26 NOTE — Discharge Instructions (Signed)
Take Tylenol 1000 mg 4 times a day for 1 week. This is the maximum dose of Tylenol you can take from all sources. Please check other over-the-counter medications and prescriptions to ensure you are not taking other medications that contain acetaminophen.  You may also take ibuprofen 400 mg 6 times a day alternating with or at the same time as tylenol.

## 2018-05-26 NOTE — ED Provider Notes (Signed)
[facility] EMERGENCY DEPARTMENT Provider Note   CSN: [id number] Arrival date & time: 05/26/18  1601     History   Chief Complaint Chief Complaint  Patient presents with  . Abscess    HPI Mary Hurley is a 28 y.o. female.  HPI   3 days of pain, redness to left lower abdomen. Recently had periumbilical abscess that drained itself, however after that she developed increasing redness, swelling and pain to the left of it and now has area about 10cm of redness to left lower abdomen. No known fevers, no vomiting. No other medical problems. Has otherwise not had hx of abscess drainage.   History reviewed. No pertinent past medical history.  There are no active problems to display for this patient.   Past Surgical History:  Procedure Laterality Date  . CESAREAN SECTION    . TUBAL LIGATION       OB History   No obstetric history on file.      Home Medications    Prior to Admission medications   Medication Sig Start Date End Date Taking? Authorizing Provider  clotrimazole (LOTRIMIN) 1 % cream Apply to affected area 2 times daily 05/09/18   Raeford Razor, MD  doxycycline (VIBRAMYCIN) 100 MG capsule Take 1 capsule (100 mg total) by mouth 2 (two) times daily for 7 days. 05/26/18 06/02/18  Alvira Monday, MD  starch (ANUSOL) 51 % suppository Place 1 suppository rectally as needed for pain. 01/01/17   Rolland Porter, MD    Family History No family history on file.  Social History Social History   Tobacco Use  . Smoking status: Current Every Day Smoker  . Smokeless tobacco: Never Used  Substance Use Topics  . Alcohol use: No  . Drug use: No     Allergies   Patient has no known allergies.   Review of Systems Review of Systems  Constitutional: Negative for chills and fever.  Respiratory: Negative for shortness of breath.   Cardiovascular: Negative for chest pain.  Gastrointestinal: Positive for abdominal pain. Negative for nausea and vomiting.  Skin:  Positive for rash and wound.     Physical Exam Updated Vital Signs BP 98/61   Pulse 96   Temp 98.2 F (36.8 C)   Resp 18   Ht 5\' 3"  (1.6 m)   Wt 88 kg   LMP 05/26/2018   SpO2 100%   BMI 34.37 kg/m   Physical Exam Vitals signs and nursing note reviewed.  Constitutional:      General: She is not in acute distress.    Appearance: She is well-developed. She is not diaphoretic.  HENT:     Head: Normocephalic and atraumatic.  Eyes:     Conjunctiva/sclera: Conjunctivae normal.  Neck:     Musculoskeletal: Normal range of motion.  Cardiovascular:     Rate and Rhythm: Normal rate and regular rhythm.  Pulmonary:     Effort: Pulmonary effort is normal. No respiratory distress.  Abdominal:     General: There is no distension.     Palpations: Abdomen is soft.     Tenderness: There is abdominal tenderness (localized to area of infection). There is no guarding.  Musculoskeletal:        General: No tenderness.  Skin:    General: Skin is warm and dry.     Findings: Rash (healing scar periumbilical, erythema approx 10cm left of umbilicus, induration, central fluctuance and tenderness) present. No erythema.  Neurological:     Mental Status: She is  alert and oriented to person, place, and time.      ED Treatments / Results  Labs (all labs ordered are listed, but only abnormal results are displayed) Labs Reviewed - No data to display  EKG None  Radiology No results found.  Procedures .Marland Kitchen.Incision and Drainage Date/Time: 05/28/2018 11:53 AM Performed by: Alvira MondaySchlossman, Makaila Windle, MD Authorized by: Alvira MondaySchlossman, Ayren Zumbro, MD   Consent:    Consent obtained:  Verbal   Consent given by:  Patient   Risks discussed:  Bleeding, incomplete drainage, infection, pain and damage to other organs   Alternatives discussed:  Referral Location:    Type:  Abscess   Size:  3   Location:  Trunk   Trunk location:  Abdomen Pre-procedure details:    Skin preparation:  Chloraprep Anesthesia (see MAR  for exact dosages):    Anesthesia method:  Local infiltration   Local anesthetic:  Lidocaine 1% WITH epi Procedure type:    Complexity:  Simple Procedure details:    Incision types:  Single straight   Scalpel blade:  11   Wound management:  Probed and deloculated and irrigated with saline   Drainage:  Purulent   Drainage amount:  Copious   Wound treatment:  Wound left open   Packing materials:  1/2 in iodoform gauze Post-procedure details:    Patient tolerance of procedure:  Tolerated well, no immediate complications   (including critical care time)  Medications Ordered in ED Medications  lidocaine-EPINEPHrine (XYLOCAINE W/EPI) 2 %-1:200000 (PF) injection (10 mLs  Given by Other 05/26/18 1938)     Initial Impression / Assessment and Plan / ED Course  I have reviewed the triage vital signs and the nursing notes.  Pertinent labs & imaging results that were available during my care of the patient were reviewed by me and considered in my medical decision making (see chart for details).     28 year old female presents with concern for abdominal wall abscess and cellulitis.  Large area of induration, smaller area of fluctuance. Incision and drainage performed with copious drainage.  No systemic symptoms.  Given rx for antibiotics and discussed reasons to return to ED in detail, risks of needing repeat I/D. Patient discharged in stable condition with understanding of reasons to return.  Final Clinical Impressions(s) / ED Diagnoses   Final diagnoses:  Abscess  Cellulitis of abdominal wall    ED Discharge Orders         Ordered    doxycycline (VIBRAMYCIN) 100 MG capsule  2 times daily     05/26/18 2005           Alvira MondaySchlossman, Nakeem Murnane, MD 05/28/18 1202

## 2018-11-07 ENCOUNTER — Emergency Department (HOSPITAL_BASED_OUTPATIENT_CLINIC_OR_DEPARTMENT_OTHER): Payer: Medicaid Other

## 2018-11-07 ENCOUNTER — Emergency Department (HOSPITAL_BASED_OUTPATIENT_CLINIC_OR_DEPARTMENT_OTHER)
Admission: EM | Admit: 2018-11-07 | Discharge: 2018-11-07 | Disposition: A | Discharge status: Home / Self Care | Payer: Medicaid Other | Attending: Emergency Medicine | Admitting: Emergency Medicine

## 2018-11-07 ENCOUNTER — Other Ambulatory Visit: Payer: Self-pay

## 2018-11-07 ENCOUNTER — Encounter (HOSPITAL_BASED_OUTPATIENT_CLINIC_OR_DEPARTMENT_OTHER): Payer: Self-pay | Admitting: *Deleted

## 2018-11-07 DIAGNOSIS — Y990 Civilian activity done for income or pay: Secondary | ICD-10-CM | POA: Insufficient documentation

## 2018-11-07 DIAGNOSIS — S93401A Sprain of unspecified ligament of right ankle, initial encounter: Secondary | ICD-10-CM | POA: Diagnosis not present

## 2018-11-07 DIAGNOSIS — Y9389 Activity, other specified: Secondary | ICD-10-CM | POA: Insufficient documentation

## 2018-11-07 DIAGNOSIS — X509XXA Other and unspecified overexertion or strenuous movements or postures, initial encounter: Secondary | ICD-10-CM | POA: Diagnosis not present

## 2018-11-07 DIAGNOSIS — S99911A Unspecified injury of right ankle, initial encounter: Secondary | ICD-10-CM | POA: Diagnosis present

## 2018-11-07 DIAGNOSIS — F17200 Nicotine dependence, unspecified, uncomplicated: Secondary | ICD-10-CM | POA: Diagnosis not present

## 2018-11-07 DIAGNOSIS — Y9289 Other specified places as the place of occurrence of the external cause: Secondary | ICD-10-CM | POA: Insufficient documentation

## 2018-11-07 MED ORDER — NAPROXEN 375 MG PO TABS: ORAL_TABLET | ORAL | 0 refills | Status: DC

## 2018-11-07 MED ORDER — NAPROXEN 250 MG PO TABS
500.0000 mg | ORAL_TABLET | Freq: Once | ORAL | Status: AC
  Administered 2018-11-07: 500 mg via ORAL
  Filled 2018-11-07: qty 2

## 2018-11-07 NOTE — ED Provider Notes (Signed)
[facility] Provider Note: Lowella DellJ. Lane Melodie Ashworth, MD, FACEP  CSN: [id number] MRN: [id number] ARRIVAL: 11/07/18 at 0019 ROOM: [facility]   CHIEF COMPLAINT  Ankle Injury   HISTORY OF PRESENT ILLNESS  11/07/18 12:35 AM Mary Hurley is a 28 y.o. female who inverted her right ankle at work about 20 minutes prior to arrival.  She is now having pain in her right ankle, primarily about the lateral malleolus, which she rates as an 8 out of 10.  Pain is worse with palpation or movement.  There is no significant swelling.  She is having some paresthesias in her toes but is able to move them well.  She denies other injury.   History reviewed. No pertinent past medical history.  Past Surgical History:  Procedure Laterality Date  . CESAREAN SECTION    . TUBAL LIGATION      History reviewed. No pertinent family history.  Social History   Tobacco Use  . Smoking status: Current Every Day Smoker  . Smokeless tobacco: Never Used  Substance Use Topics  . Alcohol use: No  . Drug use: No    Prior to Admission medications   Medication Sig Start Date End Date Taking? Authorizing Provider  naproxen (NAPROSYN) 375 MG tablet Take 1 tablet twice daily as needed for pain. 11/07/18   Larie Mathes, Jonny RuizJohn, MD    Allergies Patient has no known allergies.   REVIEW OF SYSTEMS  Negative except as noted here or in the History of Present Illness.   PHYSICAL EXAMINATION  Initial Vital Signs Blood pressure 128/86, pulse (!) 110, temperature 98.5 F (36.9 C), temperature source Oral, resp. rate 18, height 5\' 3"  (1.6 m), weight 97.5 kg, last menstrual period 10/25/2018, SpO2 100 %.  Examination General: Well-developed, well-nourished female in no acute distress; appearance consistent with age of record HENT: normocephalic; atraumatic Eyes: Normal appearance Neck: supple Heart: regular rate and rhythm Lungs: clear to auscultation bilaterally Abdomen: soft; nondistended; nontender; bowel sounds  present Extremities: No deformity; pulses normal; tenderness of lateral right ankle with mild associated swelling, right foot distally neurovascularly intact with intact tendon function Neurologic: Awake, alert and oriented; motor function intact in all extremities and symmetric; no facial droop Skin: Warm and dry Psychiatric: Normal mood and affect   RESULTS  Summary of this visit's results, reviewed by myself:   EKG Interpretation  Date/Time:    Ventricular Rate:    PR Interval:    QRS Duration:   QT Interval:    QTC Calculation:   R Axis:     Text Interpretation:        Laboratory Studies: No results found for this or any previous visit (from the past 24 hour(s)). Imaging Studies: Dg Ankle Complete Right  Result Date: 11/07/2018 CLINICAL DATA:  Ankle pain EXAM: RIGHT ANKLE - COMPLETE 3+ VIEW COMPARISON:  September 15, 2017. FINDINGS: There is no evidence of fracture, dislocation, or joint effusion. There is no evidence of arthropathy or other focal bone abnormality. Soft tissues are unremarkable. IMPRESSION: Negative. Electronically Signed   By: Katherine Mantlehristopher  Green M.D.   On: 11/07/2018 01:05    ED COURSE and MDM  Nursing notes and initial vitals signs, including pulse oximetry, reviewed.  Vitals:   11/07/18 0027 11/07/18 0029  BP: 128/86   Pulse: (!) 110   Resp: 18   Temp: 98.5 F (36.9 C)   TempSrc: Oral   SpO2: 100%   Weight:  97.5 kg  Height:  5\' 3"  (1.6 m)   Will  place an ASO and treat with naproxen.  We will follow-up with sports medicine if not healing properly.  PROCEDURES    ED DIAGNOSES     ICD-10-CM   1. Sprain of right ankle, unspecified ligament, initial encounter  S93.401A        Thelton Graca, Jenny Reichmann, MD 11/07/18 0111

## 2018-11-07 NOTE — ED Notes (Signed)
Pt understood dc material. NAD Noted. Script given at [employer]. All questions answered to satisfaction. Pt escorted to check out counter.

## 2018-11-07 NOTE — ED Triage Notes (Signed)
Pt reports that she rolled her right ankle 20 minutes PTA. Pt drover herself here. No obvious deformity noted. Pulses intact.

## 2018-12-06 ENCOUNTER — Encounter (HOSPITAL_BASED_OUTPATIENT_CLINIC_OR_DEPARTMENT_OTHER): Payer: Self-pay | Admitting: Emergency Medicine

## 2018-12-06 ENCOUNTER — Emergency Department (HOSPITAL_BASED_OUTPATIENT_CLINIC_OR_DEPARTMENT_OTHER)
Admission: EM | Admit: 2018-12-06 | Discharge: 2018-12-06 | Disposition: A | Discharge status: Home / Self Care | Payer: Medicaid Other | Attending: Emergency Medicine | Admitting: Emergency Medicine

## 2018-12-06 ENCOUNTER — Other Ambulatory Visit: Payer: Self-pay

## 2018-12-06 DIAGNOSIS — F17210 Nicotine dependence, cigarettes, uncomplicated: Secondary | ICD-10-CM | POA: Diagnosis not present

## 2018-12-06 DIAGNOSIS — Z20828 Contact with and (suspected) exposure to other viral communicable diseases: Secondary | ICD-10-CM | POA: Diagnosis not present

## 2018-12-06 DIAGNOSIS — R05 Cough: Secondary | ICD-10-CM | POA: Insufficient documentation

## 2018-12-06 DIAGNOSIS — J029 Acute pharyngitis, unspecified: Secondary | ICD-10-CM | POA: Diagnosis not present

## 2018-12-06 DIAGNOSIS — Z7189 Other specified counseling: Secondary | ICD-10-CM

## 2018-12-06 DIAGNOSIS — B349 Viral infection, unspecified: Secondary | ICD-10-CM

## 2018-12-06 LAB — GROUP A STREP BY PCR: Group A Strep by PCR: NOT DETECTED

## 2018-12-06 MED ORDER — NAPROXEN 500 MG PO TABS: 500.0000 mg | ORAL_TABLET | Freq: Two times a day (BID) | ORAL | 0 refills | Status: DC

## 2018-12-06 MED ORDER — NAPROXEN 250 MG PO TABS
500.0000 mg | ORAL_TABLET | Freq: Once | ORAL | Status: AC
  Administered 2018-12-06: 03:00:00 500 mg via ORAL

## 2018-12-06 MED ORDER — ALUM & MAG HYDROXIDE-SIMETH 200-200-20 MG/5ML PO SUSP
30.0000 mL | Freq: Once | ORAL | Status: AC
  Administered 2018-12-06: 30 mL via ORAL
  Filled 2018-12-06: qty 30

## 2018-12-06 MED ORDER — ACETAMINOPHEN 500 MG PO TABS
1000.0000 mg | ORAL_TABLET | Freq: Once | ORAL | Status: AC
  Administered 2018-12-06: 1000 mg via ORAL
  Filled 2018-12-06: qty 2

## 2018-12-06 MED ORDER — LIDOCAINE VISCOUS HCL 2 % MT SOLN
15.0000 mL | Freq: Once | OROMUCOSAL | Status: AC
  Administered 2018-12-06: 15 mL via ORAL
  Filled 2018-12-06: qty 15

## 2018-12-06 MED ORDER — NAPROXEN 250 MG PO TABS
ORAL_TABLET | ORAL | Status: AC
  Filled 2018-12-06: qty 2

## 2018-12-06 MED ORDER — NAPROXEN 250 MG PO TABS: 500.0000 mg | ORAL_TABLET | Freq: Once | ORAL | Status: DC

## 2018-12-06 NOTE — ED Notes (Signed)
Report received 

## 2018-12-06 NOTE — ED Triage Notes (Signed)
Pt arrives with c/o sore throat since Thursday. Reports dry cough as well. Reports cough that has been causing her to have rib pain and headache.

## 2018-12-06 NOTE — ED Provider Notes (Signed)
[facility] EMERGENCY DEPARTMENT Provider Note   CSN: [id number] Arrival date & time: 12/06/18  0143     History   Chief Complaint Chief Complaint  Patient presents with  . Sore Throat    HPI Mary Hurley is a 28 y.o. female.     The history is provided by the patient.  Sore Throat This is a new problem. The current episode started 12 to 24 hours ago. The problem occurs constantly. The problem has not changed since onset.Associated symptoms include headaches. Pertinent negatives include no chest pain, no abdominal pain and no shortness of breath. Associated symptoms comments: Mild dry cough. Nothing aggravates the symptoms. Nothing relieves the symptoms. She has tried nothing for the symptoms. The treatment provided no relief.  No f/c/r.  No changes in voice.  No neck pain.  No pain or difficulty swallowing.  No covid contacts but has been in the hospital with her wife.  Headache is frontal.  Denies loss of taste or smell.    History reviewed. No pertinent past medical history.  There are no active problems to display for this patient.   Past Surgical History:  Procedure Laterality Date  . CESAREAN SECTION    . TUBAL LIGATION       OB History   No obstetric history on file.      Home Medications    Prior to Admission medications   Medication Sig Start Date End Date Taking? Authorizing Provider  acetaminophen (TYLENOL) 325 MG tablet Take 650 mg by mouth every 6 (six) hours as needed.   Yes [provider]  naproxen (NAPROSYN) 375 MG tablet Take 1 tablet twice daily as needed for pain. 11/07/18   Molpus, John, MD    Family History No family history on file.  Social History Social History   Tobacco Use  . Smoking status: Current Every Day Smoker    Packs/day: 0.50    Types: Cigarettes  . Smokeless tobacco: Never Used  Substance Use Topics  . Alcohol use: No  . Drug use: No     Allergies   Patient has no known allergies.   Review  of Systems Review of Systems  Constitutional: Negative for diaphoresis and fever.  HENT: Positive for sore throat. Negative for congestion, ear pain, trouble swallowing and voice change.   Eyes: Negative for photophobia and visual disturbance.  Respiratory: Positive for cough. Negative for shortness of breath.   Cardiovascular: Negative for chest pain and leg swelling.  Gastrointestinal: Negative for abdominal pain.  Genitourinary: Negative for difficulty urinating.  Musculoskeletal: Negative for arthralgias, neck pain and neck stiffness.  Skin: Negative for color change.  Neurological: Positive for headaches. Negative for dizziness, facial asymmetry and light-headedness.  Hematological: Negative for adenopathy.  Psychiatric/Behavioral: Negative for agitation.  All other systems reviewed and are negative.    Physical Exam Updated Vital Signs BP (!) 154/102 (BP Location: Right Arm)   Pulse (!) 101   Temp 99 F (37.2 C) (Oral)   Resp 18   Ht 5\' 3"  (1.6 m)   Wt 97.5 kg   SpO2 100%   BMI 38.09 kg/m   Physical Exam Vitals signs and nursing note reviewed.  Constitutional:      General: She is not in acute distress.    Appearance: She is normal weight.     Comments: Appears comfortable in the room with all the lights on  HENT:     Head: Normocephalic and atraumatic.     Comments: No  proptosis    Nose: Nose normal.     Mouth/Throat:     Mouth: Mucous membranes are moist.     Pharynx: Oropharynx is clear. No oropharyngeal exudate or posterior oropharyngeal erythema.     Comments: mallempati 1, phonation intact no pain with displacement of the trachea Eyes:     Extraocular Movements: Extraocular movements intact.     Conjunctiva/sclera: Conjunctivae normal.     Pupils: Pupils are equal, round, and reactive to light.  Neck:     Musculoskeletal: Normal range of motion and neck supple. No neck rigidity or muscular tenderness.     Vascular: No carotid bruit.  Cardiovascular:      Rate and Rhythm: Normal rate and regular rhythm.     Pulses: Normal pulses.     Heart sounds: Normal heart sounds.  Pulmonary:     Effort: Pulmonary effort is normal. No respiratory distress.     Breath sounds: Normal breath sounds. No wheezing or rales.  Abdominal:     General: Abdomen is flat. Bowel sounds are normal.     Tenderness: There is no abdominal tenderness.  Musculoskeletal: Normal range of motion.  Lymphadenopathy:     Cervical: No cervical adenopathy.  Skin:    General: Skin is warm and dry.     Capillary Refill: Capillary refill takes less than 2 seconds.  Neurological:     General: No focal deficit present.     Mental Status: She is alert and oriented to person, place, and time.     Comments: Intact cognition  Psychiatric:        Mood and Affect: Mood normal.        Behavior: Behavior normal.      ED Treatments / Results  Labs (all labs ordered are listed, but only abnormal results are displayed) Results for orders placed or performed during the hospital encounter of 12/06/18  Group A Strep by PCR   Specimen: Throat; Sterile Swab  Result Value Ref Range   Group A Strep by PCR NOT DETECTED NOT DETECTED   Dg Ankle Complete Right  Result Date: 11/07/2018 CLINICAL DATA:  Ankle pain EXAM: RIGHT ANKLE - COMPLETE 3+ VIEW COMPARISON:  September 15, 2017. FINDINGS: There is no evidence of fracture, dislocation, or joint effusion. There is no evidence of arthropathy or other focal bone abnormality. Soft tissues are unremarkable. IMPRESSION: Negative. Electronically Signed   By: Katherine Mantlehristopher  Green M.D.   On: 11/07/2018 01:05    Radiology No results found.  Procedures Procedures (including critical care time)  Medications Ordered in ED Medications  alum & mag hydroxide-simeth (MAALOX/MYLANTA) 200-200-20 MG/5ML suspension 30 mL (30 mLs Oral Given 12/06/18 0243)    And  lidocaine (XYLOCAINE) 2 % viscous mouth solution 15 mL (15 mLs Oral Given 12/06/18 0243)   acetaminophen (TYLENOL) tablet 1,000 mg (1,000 mg Oral Given 12/06/18 0243)  naproxen (NAPROSYN) tablet 500 mg (500 mg Oral Given 12/06/18 0322)    Pain in throat improved with GI cocktail.  Headache is improving with pain medication.  She is well appearing and I do not suspect ICH. I see no signs of cavernous sinus thrombosis. PERRL, EOMI, no proptosis. I do not believe there is an intracranial infection at this time that requires imaging or LP.  Have sent a covid swab and have given strict verbal and written isolation precautions.    Mary Hurley was evaluated in Emergency Department on 12/06/2018 for the symptoms described in the history of present illness. She was evaluated  in the context of the global COVID-19 pandemic, which necessitated consideration that the patient might be at risk for infection with the SARS-CoV-2 virus that causes COVID-19. Institutional protocols and algorithms that pertain to the evaluation of patients at risk for COVID-19 are in a state of rapid change based on information released by regulatory bodies including the CDC and federal and state organizations. These policies and algorithms were followed during the patient's care in the ED.   Final Clinical Impressions(s) / ED Diagnoses   Return for intractable cough, coughing up blood,fevers >100.4 unrelieved by medication, shortness of breath, intractable vomiting, chest pain, shortness of breath, weakness,numbness, changes in speech, facial asymmetry,abdominal pain, passing out,Inability to tolerate liquids or food, cough, altered mental status or any concerns. No signs of systemic illness or infection. The patient is nontoxic-appearing on exam and vital signs are within normal limits.   I have reviewed the triage vital signs and the nursing notes. Pertinent labs &imaging results that were available during my care of the patient were reviewed by me and considered in my medical decision making (see chart for  details).  After history, exam, and medical workup I feel the patient has been appropriately medically screened and is safe for discharge home. Pertinent diagnoses were discussed with the patient. Patient was given return precautions   Cheralyn Oliver, MD 12/06/18 1497

## 2018-12-07 LAB — NOVEL CORONAVIRUS, NAA (HOSP ORDER, SEND-OUT TO REF LAB; TAT 18-24 HRS): SARS-CoV-2, NAA: NOT DETECTED

## 2019-02-13 ENCOUNTER — Encounter (HOSPITAL_BASED_OUTPATIENT_CLINIC_OR_DEPARTMENT_OTHER): Payer: Self-pay | Admitting: Emergency Medicine

## 2019-02-13 ENCOUNTER — Other Ambulatory Visit: Payer: Self-pay

## 2019-02-13 ENCOUNTER — Emergency Department (HOSPITAL_BASED_OUTPATIENT_CLINIC_OR_DEPARTMENT_OTHER)
Admission: EM | Admit: 2019-02-13 | Discharge: 2019-02-13 | Disposition: A | Discharge status: Home / Self Care | Payer: Medicaid Other | Attending: Emergency Medicine | Admitting: Emergency Medicine

## 2019-02-13 DIAGNOSIS — Z791 Long term (current) use of non-steroidal anti-inflammatories (NSAID): Secondary | ICD-10-CM | POA: Diagnosis not present

## 2019-02-13 DIAGNOSIS — F17210 Nicotine dependence, cigarettes, uncomplicated: Secondary | ICD-10-CM | POA: Insufficient documentation

## 2019-02-13 DIAGNOSIS — U071 COVID-19: Secondary | ICD-10-CM | POA: Insufficient documentation

## 2019-02-13 DIAGNOSIS — R519 Headache, unspecified: Secondary | ICD-10-CM | POA: Diagnosis present

## 2019-02-13 DIAGNOSIS — J0110 Acute frontal sinusitis, unspecified: Secondary | ICD-10-CM | POA: Insufficient documentation

## 2019-02-13 NOTE — ED Triage Notes (Signed)
Pt reports frontal HA x 1 week that pt states responds to otc medications. Pt denies congestion, denies fever, denies injury. Pt denies otc meds today. Pt denies dizziness

## 2019-02-13 NOTE — ED Notes (Signed)
Pt verbalized understanding d/c instructions and quarantine instructions 

## 2019-02-13 NOTE — Discharge Instructions (Signed)
Take 4 over the counter ibuprofen tablets 3 times a day or 2 over-the-counter naproxen tablets twice a day for pain. Also take tylenol 1000mg (2 extra strength) four times a day.    Return for fever, worsening symptoms

## 2019-02-13 NOTE — ED Provider Notes (Signed)
[facility] EMERGENCY DEPARTMENT Provider Note   CSN: [id number] Arrival date & time: 02/13/19  1220     History   Chief Complaint Chief Complaint  Patient presents with  . Headache    HPI Mary Hurley is a 28 y.o. female.     28 yo F with a chief complaints of a headache.  Going on for about 5 days.  Gets better with Tylenol but continues to recur each day.  Denies cough congestion or fever.  She had her wisdom teeth taken out about a week and a half ago.  Had upper as well as lower.  No unilateral numbness or weakness no difficulty speech or swallowing no significant history of headache.  The history is provided by the patient.  Headache Associated symptoms: no congestion, no dizziness, no fever, no myalgias, no nausea, no sinus pressure and no vomiting   Illness Severity:  Moderate Onset quality:  Gradual Duration:  5 days Timing:  Constant Progression:  Worsening Chronicity:  New Associated symptoms: headaches   Associated symptoms: no chest pain, no congestion, no fever, no myalgias, no nausea, no rhinorrhea, no shortness of breath, no vomiting and no wheezing     History reviewed. No pertinent past medical history.  There are no active problems to display for this patient.   Past Surgical History:  Procedure Laterality Date  . CESAREAN SECTION    . TUBAL LIGATION       OB History   No obstetric history on file.      Home Medications    Prior to Admission medications   Medication Sig Start Date End Date Taking? Authorizing Provider  acetaminophen (TYLENOL) 325 MG tablet Take 650 mg by mouth every 6 (six) hours as needed.    [provider]  naproxen (NAPROSYN) 375 MG tablet Take 1 tablet twice daily as needed for pain. 11/07/18   Molpus, John, MD  naproxen (NAPROSYN) 500 MG tablet Take 1 tablet (500 mg total) by mouth 2 (two) times daily with a meal. 12/06/18   Palumbo, April, MD    Family History History reviewed. No pertinent  family history.  Social History Social History   Tobacco Use  . Smoking status: Current Every Day Smoker    Packs/day: 0.50    Types: Cigarettes  . Smokeless tobacco: Never Used  Substance Use Topics  . Alcohol use: Yes  . Drug use: No     Allergies   Patient has no known allergies.   Review of Systems Review of Systems  Constitutional: Negative for chills and fever.  HENT: Negative for congestion, rhinorrhea, sinus pressure and sinus pain.   Eyes: Negative for redness and visual disturbance.  Respiratory: Negative for shortness of breath and wheezing.   Cardiovascular: Negative for chest pain and palpitations.  Gastrointestinal: Negative for nausea and vomiting.  Genitourinary: Negative for dysuria and urgency.  Musculoskeletal: Negative for arthralgias and myalgias.  Skin: Negative for pallor and wound.  Neurological: Positive for headaches. Negative for dizziness.     Physical Exam Updated Vital Signs BP 129/90 (BP Location: Left Arm)   Pulse 97   Temp 98.6 F (37 C) (Oral)   Resp 18   Ht 5\' 3"  (1.6 m)   Wt 97.5 kg   LMP 01/25/2019 (Exact Date)   SpO2 100%   BMI 38.09 kg/m   Physical Exam Vitals signs and nursing note reviewed.  Constitutional:      General: She is not in acute distress.  Appearance: She is well-developed. She is not diaphoretic.  HENT:     Head: Normocephalic and atraumatic.     Comments: Swollen turbinates, posterior nasal drip, sinus tenderness worst to the R frontal sinus, tm with bilateral effusion without bulging.   Eyes:     Pupils: Pupils are equal, round, and reactive to light.  Neck:     Musculoskeletal: Normal range of motion and neck supple.  Cardiovascular:     Rate and Rhythm: Normal rate and regular rhythm.     Heart sounds: No murmur. No friction rub. No gallop.   Pulmonary:     Effort: Pulmonary effort is normal.     Breath sounds: No wheezing or rales.  Abdominal:     General: There is no distension.      Palpations: Abdomen is soft.     Tenderness: There is no abdominal tenderness.  Musculoskeletal:        General: No tenderness.  Skin:    General: Skin is warm and dry.  Neurological:     Mental Status: She is alert and oriented to person, place, and time.     Comments: Benign neurologic exam  Psychiatric:        Behavior: Behavior normal.      ED Treatments / Results  Labs (all labs ordered are listed, but only abnormal results are displayed) Labs Reviewed  NOVEL CORONAVIRUS, NAA (HOSP ORDER, SEND-OUT TO REF LAB; TAT 18-24 HRS)    EKG None  Radiology No results found.  Procedures Procedures (including critical care time)  Medications Ordered in ED Medications - No data to display   Initial Impression / Assessment and Plan / ED Course  I have reviewed the triage vital signs and the nursing notes.  Pertinent labs & imaging results that were available during my care of the patient were reviewed by me and considered in my medical decision making (see chart for details).        28 yo F with a cc of a headache. Going on for the past 5 days. Had wisdom teeth removed about 1.5 weeks ago. No fever.    Patient has no neurologic deficit.  I doubt that this is infectious complication from her wisdom teeth surgery it seems timeline wise to not be consistent.  She could have TMJ syndrome, she does have some sinus congestion and some sinus tenderness with percussion.  Will start on antibiotics for possible sinusitis.  We will have her call her oral surgeon and let them know about her symptoms.  PCP follow-up.  3:08 PM:  I have discussed the diagnosis/risks/treatment options with the patient and believe the pt to be eligible for discharge home to follow-up with PCP, oral surgery. We also discussed returning to the ED immediately if new or worsening sx occur. We discussed the sx which are most concerning (e.g., sudden worsening pain, fever, inability to tolerate by mouth) that  necessitate immediate return. Medications administered to the patient during their visit and any new prescriptions provided to the patient are listed below.  Medications given during this visit Medications - No data to display   The patient appears reasonably screen and/or stabilized for discharge and I doubt any other medical condition or other [facility] requiring further screening, evaluation, or treatment in the ED at this time prior to discharge.    Final Clinical Impressions(s) / ED Diagnoses   Final diagnoses:  Acute non-recurrent frontal sinusitis    ED Discharge Orders    None  Deno Etienne, DO 02/13/19 [phone]

## 2019-02-16 LAB — NOVEL CORONAVIRUS, NAA (HOSP ORDER, SEND-OUT TO REF LAB; TAT 18-24 HRS): SARS-CoV-2, NAA: DETECTED — AB

## 2020-02-11 ENCOUNTER — Encounter (HOSPITAL_BASED_OUTPATIENT_CLINIC_OR_DEPARTMENT_OTHER): Payer: Self-pay | Admitting: *Deleted

## 2020-02-11 ENCOUNTER — Emergency Department (HOSPITAL_BASED_OUTPATIENT_CLINIC_OR_DEPARTMENT_OTHER): Payer: Medicaid Other

## 2020-02-11 ENCOUNTER — Other Ambulatory Visit: Payer: Self-pay

## 2020-02-11 ENCOUNTER — Emergency Department (HOSPITAL_BASED_OUTPATIENT_CLINIC_OR_DEPARTMENT_OTHER)
Admission: EM | Admit: 2020-02-11 | Discharge: 2020-02-11 | Disposition: A | Discharge status: Home / Self Care | Payer: Medicaid Other | Attending: Emergency Medicine | Admitting: Emergency Medicine

## 2020-02-11 DIAGNOSIS — Z20822 Contact with and (suspected) exposure to covid-19: Secondary | ICD-10-CM | POA: Diagnosis not present

## 2020-02-11 DIAGNOSIS — M79644 Pain in right finger(s): Secondary | ICD-10-CM | POA: Diagnosis not present

## 2020-02-11 DIAGNOSIS — R519 Headache, unspecified: Secondary | ICD-10-CM | POA: Diagnosis present

## 2020-02-11 DIAGNOSIS — J069 Acute upper respiratory infection, unspecified: Secondary | ICD-10-CM | POA: Insufficient documentation

## 2020-02-11 DIAGNOSIS — S6990XA Unspecified injury of unspecified wrist, hand and finger(s), initial encounter: Secondary | ICD-10-CM

## 2020-02-11 LAB — RESP PANEL BY RT PCR (RSV, FLU A&B, COVID)
Influenza A by PCR: NEGATIVE
Influenza B by PCR: NEGATIVE
Respiratory Syncytial Virus by PCR: NEGATIVE
SARS Coronavirus 2 by RT PCR: NEGATIVE

## 2020-02-11 LAB — GROUP A STREP BY PCR: Group A Strep by PCR: NOT DETECTED

## 2020-02-11 NOTE — ED Provider Notes (Signed)
[facility] EMERGENCY DEPARTMENT Provider Note   CSN: [id number] Arrival date & time: 02/11/20  1737     History Chief Complaint  Patient presents with  . Facial Pain  . Finger Injury    Mary Hurley is a 29 y.o. female.  HPI   Patient with no significant medical history presents to the emergency department with chief complaint of headaches, ear pain, cough, congestion.  She also endorses that she jammed right thumb today and would like a x-ray, she denies hitting her head, losing consciousness, is not on anticoags.  Patient states she was upset and hit the wall with the palm of her hand injuring her right thumb.  Patient states her URI symptoms started 2 days ago.  She complains of nasal congestion, facial pressures, left ear pain and a productive cough.  She denies fevers, chills, nausea, vomiting, diarrhea, decrease in appetite.  She is currently not Covid vaccinated denies any recent sick contacts or recent travels.  She has not taking any medications for her symptoms.  She denies any alleviating factors at this time.  Patient denies chest pain, shortness of breath, abdominal pain, nausea, vomiting, diarrhea, pedal edema.  History reviewed. No pertinent past medical history.  There are no problems to display for this patient.   Past Surgical History:  Procedure Laterality Date  . CESAREAN SECTION    . FOOT SURGERY    . TUBAL LIGATION       OB History   No obstetric history on file.     No family history on file.  Social History   Tobacco Use  . Smoking status: Current Every Day Smoker    Packs/day: 0.50    Types: Cigarettes  . Smokeless tobacco: Never Used  Substance Use Topics  . Alcohol use: Yes  . Drug use: No    Home Medications Prior to Admission medications   Medication Sig Start Date End Date Taking? Authorizing Provider  acetaminophen (TYLENOL) 325 MG tablet Take 650 mg by mouth every 6 (six) hours as needed.    [provider]    naproxen (NAPROSYN) 375 MG tablet Take 1 tablet twice daily as needed for pain. 11/07/18   Molpus, Jonny Ruiz, MD  naproxen (NAPROSYN) 500 MG tablet Take 1 tablet (500 mg total) by mouth 2 (two) times daily with a meal. 12/06/18   Palumbo, April, MD    Allergies    Patient has no known allergies.  Review of Systems   Review of Systems  Constitutional: Negative for chills and fever.  HENT: Positive for congestion, ear pain and sore throat.   Eyes: Negative for visual disturbance.  Respiratory: Positive for cough. Negative for shortness of breath.   Cardiovascular: Negative for chest pain.  Gastrointestinal: Negative for abdominal pain, diarrhea, nausea and vomiting.  Genitourinary: Negative for enuresis.  Musculoskeletal: Negative for back pain.       Endorses right thumb pain.  Skin: Negative for rash.  Neurological: Positive for headaches. Negative for dizziness.  Hematological: Does not bruise/bleed easily.    Physical Exam Updated Vital Signs BP 109/73 (BP Location: Right Arm)   Pulse 92   Temp 98.4 F (36.9 C) (Oral)   Resp 20   Ht 5\' 3"  (1.6 m)   Wt 101.4 kg   LMP 01/14/2020   SpO2 100%   BMI 39.61 kg/m   Physical Exam Vitals and nursing note reviewed.  Constitutional:      General: She is not in acute distress.  Appearance: She is not ill-appearing.  HENT:     Head: Normocephalic and atraumatic.     Right Ear: Tympanic membrane, ear canal and external ear normal.     Left Ear: Tympanic membrane, ear canal and external ear normal.     Nose: No congestion.     Mouth/Throat:     Mouth: Mucous membranes are moist.     Pharynx: Oropharynx is clear. No oropharyngeal exudate or posterior oropharyngeal erythema.  Eyes:     General: No scleral icterus. Cardiovascular:     Rate and Rhythm: Normal rate and regular rhythm.     Pulses: Normal pulses.     Heart sounds: No murmur heard.  No friction rub. No gallop.   Pulmonary:     Effort: No respiratory distress.      Breath sounds: No wheezing, rhonchi or rales.  Abdominal:     General: There is no distension.     Palpations: Abdomen is soft.     Tenderness: There is no abdominal tenderness. There is no right CVA tenderness, left CVA tenderness or guarding.  Musculoskeletal:        General: Tenderness present. No swelling.     Right lower leg: No edema.     Left lower leg: No edema.     Comments: Patient's right hand was visualized, no edema, erythema, lacerations, abrasions or other gross abnormalities noted.  She had full range of motion of her fingers and thumb.  Patient's thumb was tender to palpation at the proximal joint, no crepitus, deformities noted.  Neurovascular fully intact.  Skin:    General: Skin is warm and dry.     Findings: No rash.  Neurological:     Mental Status: She is alert.  Psychiatric:        Mood and Affect: Mood normal.     ED Results / Procedures / Treatments   Labs (all labs ordered are listed, but only abnormal results are displayed) Labs Reviewed  GROUP A STREP BY PCR  RESP PANEL BY RT PCR (RSV, FLU A&B, COVID)    EKG None  Radiology DG Hand Complete Right  Result Date: 02/11/2020 CLINICAL DATA:  RIGHT hand injury. Pain at the first metacarpophalangeal joint. EXAM: RIGHT HAND - COMPLETE 3+ VIEW COMPARISON:  None. FINDINGS: There is no evidence of fracture or dislocation. There is no evidence of arthropathy or other focal bone abnormality. Soft tissues are unremarkable. IMPRESSION: Negative. Electronically Signed   By: Norva Pavlov M.D.   On: 02/11/2020 18:10    Procedures Procedures (including critical care time)  Medications Ordered in ED Medications - No data to display  ED Course  I have reviewed the triage vital signs and the nursing notes.  Pertinent labs & imaging results that were available during my care of the patient were reviewed by me and considered in my medical decision making (see chart for details).    MDM  Rules/Calculators/A&P                          I have personally reviewed all imaging, labs and have interpreted them.  Patient presents with URI symptoms and right thumb injury.  She is alert, did not appear acute distress, vital signs segment for tachycardia.  Will order strep test and x-ray of thumb with respiratory panel.  Hand x-ray does not reveal any symptom findings. Strep test is negative.   Low suspicion for fracture or dislocation as x-ray does not  show any significant findings. low suspicion for ligament or tendon damage as area was palpated no gross defects noted, they had full range of motion at her thumb and fingers..  Low suspicion for compartment syndrome as area was palpated it was soft to the touch, neurovascular fully intact. Low suspicion for systemic infection as patient is nontoxic-appearing, vital signs reassuring, no obvious source infection noted on exam.  Low suspicion for pneumonia as lung sounds are clear bilaterally.  I have low suspicion for PE as patient denies pleuritic chest pain, shortness of breath no pedal edema noted on exam. low suspicion for strep throat as oropharynx was visualized, no erythema or exudates noted, strep test was negative.  Low suspicion patient would need  hospitalized due to viral infection or Covid as vital signs reassuring, patient is not in respiratory distress.  I suspect patient strained her right thumb, will place her in a splint and have her follow-up with hand for further evaluation.  I suspect patient has a URI will recommend over-the-counter pain medications follow-up with PCP for further evaluation.  If Covid is positive will refer to infusion clinic.  Vital signs have remained stable, no indication for hospital admission.  Patient given at home care as well strict return precautions.  Patient verbalized that they understood agreed to said plan.   Final Clinical Impression(s) / ED Diagnoses Final diagnoses:  Pain of right thumb   Upper respiratory tract infection, unspecified type    Rx / DC Orders ED Discharge Orders    None       Carroll Sage, PA-C 02/11/20 1925    Pollyann Savoy, MD 02/11/20 1950

## 2020-02-11 NOTE — ED Triage Notes (Addendum)
Facial pain, headache and left ear pain x 2 days. She also jammed her right finger this am and needs an xray.

## 2020-02-11 NOTE — ED Notes (Signed)
ED Provider at bedside. 

## 2020-02-11 NOTE — Discharge Instructions (Addendum)
You have been seen here for URI like symptoms.  I recommend taking Tylenol for fever control and ibuprofen for pain control please follow dosing on the back of bottle.  I recommend staying hydrated and if you do not an appetite, I recommend soups as this will provide you with fluids and calories.  Your Covid test is pending I recommend self quarantine until you get your results back on MyChart.  If you are Covid positive you must self quarantine for 10 days starting on symptom onset.  I would like you to contact "post Covid care" as they will provide you with information how to manage your Covid symptoms.  I provided you with a splint please wear for the first week.  Do not wear at nighttime.  I recommend placing ice on the area to help decrease pain and inflammation.  You may take over-the-counter pain medication like ibuprofen or Tylenol every 6 hours as needed.  Please contact the  hand specialist for further follow-up.   Come back to the emergency department if you develop chest pain, shortness of breath, severe abdominal pain, uncontrolled nausea, vomiting, diarrhea.

## 2020-04-28 ENCOUNTER — Other Ambulatory Visit: Payer: Self-pay

## 2020-04-28 ENCOUNTER — Emergency Department (HOSPITAL_BASED_OUTPATIENT_CLINIC_OR_DEPARTMENT_OTHER)
Admission: EM | Admit: 2020-04-28 | Discharge: 2020-04-28 | Disposition: A | Discharge status: Home / Self Care | Payer: Medicaid Other | Attending: Emergency Medicine | Admitting: Emergency Medicine

## 2020-04-28 ENCOUNTER — Encounter (HOSPITAL_BASED_OUTPATIENT_CLINIC_OR_DEPARTMENT_OTHER): Payer: Self-pay | Admitting: Emergency Medicine

## 2020-04-28 DIAGNOSIS — K224 Dyskinesia of esophagus: Secondary | ICD-10-CM | POA: Insufficient documentation

## 2020-04-28 DIAGNOSIS — E119 Type 2 diabetes mellitus without complications: Secondary | ICD-10-CM | POA: Insufficient documentation

## 2020-04-28 DIAGNOSIS — F17210 Nicotine dependence, cigarettes, uncomplicated: Secondary | ICD-10-CM | POA: Diagnosis not present

## 2020-04-28 DIAGNOSIS — R079 Chest pain, unspecified: Secondary | ICD-10-CM | POA: Diagnosis present

## 2020-04-28 DIAGNOSIS — U071 COVID-19: Secondary | ICD-10-CM | POA: Diagnosis not present

## 2020-04-28 DIAGNOSIS — E1100 Type 2 diabetes mellitus with hyperosmolarity without nonketotic hyperglycemic-hyperosmolar coma (NKHHC): Secondary | ICD-10-CM | POA: Diagnosis present

## 2020-04-28 LAB — CBC WITH DIFFERENTIAL/PLATELET
Abs Immature Granulocytes: 0.02 10*3/uL (ref 0.00–0.07)
Basophils Absolute: 0 10*3/uL (ref 0.0–0.1)
Basophils Relative: 0 %
Eosinophils Absolute: 0.1 10*3/uL (ref 0.0–0.5)
Eosinophils Relative: 1 %
HCT: 42 % (ref 36.0–46.0)
Hemoglobin: 13.9 g/dL (ref 12.0–15.0)
Immature Granulocytes: 0 %
Lymphocytes Relative: 23 %
Lymphs Abs: 1.6 10*3/uL (ref 0.7–4.0)
MCH: 28.3 pg (ref 26.0–34.0)
MCHC: 33.1 g/dL (ref 30.0–36.0)
MCV: 85.4 fL (ref 80.0–100.0)
Monocytes Absolute: 0.6 10*3/uL (ref 0.1–1.0)
Monocytes Relative: 9 %
Neutro Abs: 4.5 10*3/uL (ref 1.7–7.7)
Neutrophils Relative %: 67 %
Platelets: 182 10*3/uL (ref 150–400)
RBC: 4.92 MIL/uL (ref 3.87–5.11)
RDW: 13.2 % (ref 11.5–15.5)
Smear Review: NORMAL
WBC: 6.8 10*3/uL (ref 4.0–10.5)
nRBC: 0 % (ref 0.0–0.2)

## 2020-04-28 LAB — I-STAT VENOUS BLOOD GAS, ED
Acid-base deficit: 2 mmol/L (ref 0.0–2.0)
Bicarbonate: 23 mmol/L (ref 20.0–28.0)
Calcium, Ion: 1.18 mmol/L (ref 1.15–1.40)
HCT: 40 % (ref 36.0–46.0)
Hemoglobin: 13.6 g/dL (ref 12.0–15.0)
O2 Saturation: 94 %
Patient temperature: 98.3
Potassium: 4.6 mmol/L (ref 3.5–5.1)
Sodium: 129 mmol/L — ABNORMAL LOW (ref 135–145)
TCO2: 24 mmol/L (ref 22–32)
pCO2, Ven: 40.8 mmHg — ABNORMAL LOW (ref 44.0–60.0)
pH, Ven: 7.359 (ref 7.250–7.430)
pO2, Ven: 74 mmHg — ABNORMAL HIGH (ref 32.0–45.0)

## 2020-04-28 LAB — RESP PANEL BY RT-PCR (FLU A&B, COVID) ARPGX2
Influenza A by PCR: NEGATIVE
Influenza B by PCR: NEGATIVE
SARS Coronavirus 2 by RT PCR: POSITIVE — AB

## 2020-04-28 LAB — BASIC METABOLIC PANEL
Anion gap: 13 (ref 5–15)
BUN: 13 mg/dL (ref 6–20)
CO2: 22 mmol/L (ref 22–32)
Calcium: 9.1 mg/dL (ref 8.9–10.3)
Chloride: 88 mmol/L — ABNORMAL LOW (ref 98–111)
Creatinine, Ser: 1.07 mg/dL — ABNORMAL HIGH (ref 0.44–1.00)
GFR, Estimated: >60 mL/min (ref >60)
Glucose, Bld: 894 mg/dL (ref 70–99)
Potassium: 4.8 mmol/L (ref 3.5–5.1)
Sodium: 123 mmol/L — ABNORMAL LOW (ref 135–145)

## 2020-04-28 LAB — CBG MONITORING, ED
Glucose-Capillary: 458 mg/dL — ABNORMAL HIGH (ref 70–99)
Glucose-Capillary: >600 mg/dL (ref 70–99)
Glucose-Capillary: 387 mg/dL — ABNORMAL HIGH (ref 70–99)
Glucose-Capillary: 328 mg/dL — ABNORMAL HIGH (ref 70–99)

## 2020-04-28 LAB — PREGNANCY, URINE: Preg Test, Ur: NEGATIVE

## 2020-04-28 MED ORDER — SODIUM CHLORIDE 0.9 % IV SOLN
80.0000 mg | Freq: Once | INTRAVENOUS | Status: AC
  Administered 2020-04-28: 80 mg via INTRAVENOUS
  Filled 2020-04-28: qty 80

## 2020-04-28 MED ORDER — ONDANSETRON HCL 4 MG/2ML IJ SOLN
4.0000 mg | Freq: Once | INTRAMUSCULAR | Status: AC
  Administered 2020-04-28: 4 mg via INTRAVENOUS
  Filled 2020-04-28: qty 2

## 2020-04-28 MED ORDER — DILTIAZEM HCL 25 MG/5ML IV SOLN: 20.0000 mg | Freq: Once | INTRAVENOUS | Status: DC

## 2020-04-28 MED ORDER — LACTATED RINGERS IV BOLUS
1000.0000 mL | Freq: Once | INTRAVENOUS | Status: AC
  Administered 2020-04-28: 1000 mL via INTRAVENOUS

## 2020-04-28 MED ORDER — DILTIAZEM HCL 25 MG/5ML IV SOLN
20.0000 mg | Freq: Once | INTRAVENOUS | Status: AC
  Administered 2020-04-28: 20 mg via INTRAVENOUS
  Filled 2020-04-28: qty 5

## 2020-04-28 MED ORDER — PANTOPRAZOLE SODIUM 40 MG IV SOLR
INTRAVENOUS | Status: AC
  Filled 2020-04-28: qty 80

## 2020-04-28 MED ORDER — LACTATED RINGERS IV SOLN: INTRAVENOUS | Status: DC

## 2020-04-28 MED ORDER — SODIUM CHLORIDE 0.9 % IV BOLUS
1000.0000 mL | Freq: Once | INTRAVENOUS | Status: AC
  Administered 2020-04-28: 1000 mL via INTRAVENOUS

## 2020-04-28 MED ORDER — INSULIN REGULAR(HUMAN) IN NACL 100-0.9 UT/100ML-% IV SOLN
INTRAVENOUS | Status: AC
  Administered 2020-04-28: 6.5 [IU]/h via INTRAVENOUS
  Filled 2020-04-28: qty 100

## 2020-04-28 MED ORDER — INSULIN REGULAR(HUMAN) IN NACL 100-0.9 UT/100ML-% IV SOLN: INTRAVENOUS | Status: DC

## 2020-04-28 MED ORDER — DEXTROSE IN LACTATED RINGERS 5 % IV SOLN: INTRAVENOUS | Status: DC

## 2020-04-28 MED ORDER — DEXTROSE 50 % IV SOLN: 0.0000 mL | INTRAVENOUS | Status: DC | PRN

## 2020-04-28 NOTE — ED Triage Notes (Signed)
Pt states she feels like something is stuck in her chest  Pt states she has not been able to eat for 2 days  Pt states every time she tries to eat it feels like it gets stuck  Pt states she has been throwing up foamy liquid  States it hurts to drink or eat anything   Pt states she has tried antiacid tablets without relief

## 2020-04-28 NOTE — ED Notes (Signed)
Patient wants to leave AMA.  Encouraged to stay due to blood sugar being high.  Dr. Audley Hose was notified of patient's desire to leave.   Dr. Audley Hose to the room and explained to her the risk factor of leaving.

## 2020-04-28 NOTE — ED Provider Notes (Addendum)
[facility] Provider Note: Lowella Dell, MD, FACEP  CSN: [id number] MRN: [id number] ARRIVAL: 04/28/20 at 0328 ROOM: [facility]   CHIEF COMPLAINT  Dysphagia   HISTORY OF PRESENT ILLNESS  04/28/20 4:25 AM Mary Hurley is a 30 y.o. female with 2 days of chest pain.  The pain is in the center of her chest and feels like something is stuck in her esophagus.  It is exacerbated by attempting to eat or drink anything.  The food is not coming back up but when she does eat or drink she has foamy liquid come up in her throat.  She has a history of GERD but is not currently on any GERD medication.  She has taken antacids without relief.  She is not aware of anything that may have triggered this such as eating unchewed food like steak or taking a medication such as doxycycline.   History reviewed. No pertinent past medical history.  Past Surgical History:  Procedure Laterality Date  . CESAREAN SECTION    . FOOT SURGERY    . TUBAL LIGATION      History reviewed. No pertinent family history.  Social History   Tobacco Use  . Smoking status: Current Every Day Smoker    Packs/day: 0.50    Types: Cigarettes  . Smokeless tobacco: Never Used  Vaping Use  . Vaping Use: Never used  Substance Use Topics  . Alcohol use: Yes  . Drug use: No    Prior to Admission medications   Medication Sig Start Date End Date Taking? Authorizing Provider  acetaminophen (TYLENOL) 325 MG tablet Take 650 mg by mouth every 6 (six) hours as needed.    [provider]  naproxen (NAPROSYN) 375 MG tablet Take 1 tablet twice daily as needed for pain. 11/07/18   Kiaja Shorty, Jonny Ruiz, MD  naproxen (NAPROSYN) 500 MG tablet Take 1 tablet (500 mg total) by mouth 2 (two) times daily with a meal. 12/06/18   Palumbo, April, MD    Allergies Patient has no known allergies.   REVIEW OF SYSTEMS  Negative except as noted here or in the History of Present Illness.   PHYSICAL EXAMINATION  Initial Vital  Signs Blood pressure 114/78, pulse (!) 118, temperature 98.3 F (36.8 C), temperature source Oral, resp. rate 20, height 5\' 2"  (1.575 m), weight 99.8 kg, last menstrual period 04/15/2020, SpO2 97 %.  Examination General: Well-developed, well-nourished female in no acute distress; appearance consistent with age of record HENT: normocephalic; atraumatic Eyes: pupils equal, round and reactive to light; extraocular muscles intact Neck: supple Heart: regular rate and rhythm; no murmurs, rubs or gallops Lungs: clear to auscultation bilaterally Abdomen: soft; nondistended; nontender; no masses or hepatosplenomegaly; bowel sounds present Extremities: No deformity; full range of motion; pulses normal Neurologic: Awake, alert and oriented; motor function intact in all extremities and symmetric; no facial droop Skin: Warm and dry Psychiatric: Normal mood and affect   RESULTS  Summary of this visit's results, reviewed and interpreted by myself:   EKG Interpretation  Date/Time:  Friday April 28 2020 04:04:03 EST Ventricular Rate:  115 PR Interval:    QRS Duration: 93 QT Interval:  320 QTC Calculation: 443 R Axis:   37 Text Interpretation: Sinus tachycardia LAE, consider biatrial enlargement Left ventricular hypertrophy No previous ECGs available Confirmed by Reuben Knoblock, 04-06-1988 (Jonny Ruiz) on 04/28/2020 4:10:29 AM      Laboratory Studies: Results for orders placed or performed during the hospital encounter of 04/28/20 (from the past 24 hour(s))  CBC with Differential/Platelet     Status: None   Collection Time: 04/28/20  4:30 AM  Result Value Ref Range   WBC 6.8 4.0 - 10.5 K/uL   RBC 4.92 3.87 - 5.11 MIL/uL   Hemoglobin 13.9 12.0 - 15.0 g/dL   HCT 35.4 65.6 - 81.2 %   MCV 85.4 80.0 - 100.0 fL   MCH 28.3 26.0 - 34.0 pg   MCHC 33.1 30.0 - 36.0 g/dL   RDW 75.1 70.0 - 17.4 %   Platelets 182 150 - 400 K/uL   nRBC 0.0 0.0 - 0.2 %   Neutrophils Relative % 67 %   Neutro Abs 4.5 1.7 - 7.7 K/uL    Lymphocytes Relative 23 %   Lymphs Abs 1.6 0.7 - 4.0 K/uL   Monocytes Relative 9 %   Monocytes Absolute 0.6 0.1 - 1.0 K/uL   Eosinophils Relative 1 %   Eosinophils Absolute 0.1 0.0 - 0.5 K/uL   Basophils Relative 0 %   Basophils Absolute 0.0 0.0 - 0.1 K/uL   WBC Morphology MORPHOLOGY UNREMARKABLE    RBC Morphology MORPHOLOGY UNREMARKABLE    Smear Review Normal platelet morphology    Immature Granulocytes 0 %   Abs Immature Granulocytes 0.02 0.00 - 0.07 K/uL  Basic metabolic panel     Status: Abnormal   Collection Time: 04/28/20  4:30 AM  Result Value Ref Range   Sodium 123 (L) 135 - 145 mmol/L   Potassium 4.8 3.5 - 5.1 mmol/L   Chloride 88 (L) 98 - 111 mmol/L   CO2 22 22 - 32 mmol/L   Glucose, Bld 894 (HH) 70 - 99 mg/dL   BUN 13 6 - 20 mg/dL   Creatinine, Ser 9.44 (H) 0.44 - 1.00 mg/dL   Calcium 9.1 8.9 - 96.7 mg/dL   GFR, Estimated >59 >16 mL/min   Anion gap 13 5 - 15  Pregnancy, urine     Status: None   Collection Time: 04/28/20  5:27 AM  Result Value Ref Range   Preg Test, Ur NEGATIVE NEGATIVE  CBG monitoring, ED     Status: Abnormal   Collection Time: 04/28/20  5:37 AM  Result Value Ref Range   Glucose-Capillary >600 (HH) 70 - 99 mg/dL  I-Stat venous blood gas, ED     Status: Abnormal   Collection Time: 04/28/20  5:45 AM  Result Value Ref Range   pH, Ven 7.359 7.250 - 7.430   pCO2, Ven 40.8 (L) 44.0 - 60.0 mmHg   pO2, Ven 74.0 (H) 32.0 - 45.0 mmHg   Bicarbonate 23.0 20.0 - 28.0 mmol/L   TCO2 24 22 - 32 mmol/L   O2 Saturation 94.0 %   Acid-base deficit 2.0 0.0 - 2.0 mmol/L   Sodium 129 (L) 135 - 145 mmol/L   Potassium 4.6 3.5 - 5.1 mmol/L   Calcium, Ion 1.18 1.15 - 1.40 mmol/L   HCT 40.0 36.0 - 46.0 %   Hemoglobin 13.6 12.0 - 15.0 g/dL   Patient temperature 38.4 F    Collection site IV start    Drawn by RT    Sample type VENOUS   CBG monitoring, ED     Status: Abnormal   Collection Time: 04/28/20  6:15 AM  Result Value Ref Range   Glucose-Capillary 458 (H)  70 - 99 mg/dL   Imaging Studies: No results found.  ED COURSE and MDM  Nursing notes, initial and subsequent vitals signs, including pulse oximetry, reviewed and interpreted by myself.  Vitals:   04/28/20 0458 04/28/20 0515 04/28/20 0530 04/28/20 0545  BP:  115/77 107/81 95/65  Pulse: 94 93 91 98  Resp: (!) 23 14 16 17   Temp:      TempSrc:      SpO2: 95% 98% 100% 99%  Weight:      Height:       Medications  pantoprazole (PROTONIX) 40 MG injection (  Canceled Entry 04/28/20 0452)  insulin regular, human (MYXREDLIN) 100 units/ 100 mL infusion (3.4 Units/hr Intravenous Rate/Dose Change 04/28/20 0616)  lactated ringers infusion (has no administration in time range)  dextrose 5 % in lactated ringers infusion (has no administration in time range)  dextrose 50 % solution 0-50 mL (has no administration in time range)  sodium chloride 0.9 % bolus 1,000 mL (0 mLs Intravenous Stopped 04/28/20 0526)  ondansetron (ZOFRAN) injection 4 mg (4 mg Intravenous Given 04/28/20 0437)  pantoprazole (PROTONIX) 80 mg in sodium chloride 0.9 % 100 mL IVPB (0 mg Intravenous Stopped 04/28/20 0525)  diltiazem (CARDIZEM) injection 20 mg (20 mg Intravenous Given 04/28/20 0447)  lactated ringers bolus 1,000 mL (1,000 mLs Intravenous New Bag/Given 04/28/20 0543)   5:33 AM Patient initially treated for presumed esophageal spasm.  Patient's blood sugar found to be 894.  Additional IV fluid bolus and IV insulin drip started.  Patient has no history of diabetes.  She does endorse polyuria and polydipsia on reevaluation.  6:45 AM Dr. 04/30/20 to admit to hospitalist service.   PROCEDURES  Procedures CRITICAL CARE Performed by: Robb Matar Anae Hams Total critical care time: 30 minutes Critical care time was exclusive of separately billable procedures and treating other patients. Critical care was necessary to treat or prevent imminent or life-threatening deterioration. Critical care was time spent personally by me on the  following activities: development of treatment plan with patient and/or surrogate as well as nursing, discussions with consultants, evaluation of patient's response to treatment, examination of patient, obtaining history from patient or surrogate, ordering and performing treatments and interventions, ordering and review of laboratory studies, ordering and review of radiographic studies, pulse oximetry and re-evaluation of patient's condition.   ED DIAGNOSES     ICD-10-CM   1. Diabetes mellitus, new onset (HCC)  E11.9   2. Esophageal spasm  K22.4        Mary Zeiss, MD 04/28/20 0645    04/30/20, MD 05/08/20 2232

## 2020-04-28 NOTE — ED Provider Notes (Signed)
Called over by nursing team, patient states that she no longer wants in the hospital and wants to leave AGAINST MEDICAL ADVICE.  I advised her that her blood sugars over 900 that she is going to, have severe acidosis and potentially die.  Shakes her head that she understands all this and has no questions for me but is tired of staying in the hospital.  She states that she will see outpatient physicians, advised her that there are likely going to just send her back to the ER given her blood sugars greater than 900.  Patient was understanding appear to have decision-making capacity, refusing further medical care at this time and demanding to leave immediately.  Attempted to call the patient's admitting team, as well as diabetes outpatient coordinators to coordinate care for her.  However patient left prior to me being able to get a call back.  Patient's COVID test also returned after she left as COVID positive.  Nursing team tried to contact her on her listed numbers but were unable to reach her at this time.  I have spoken with outpatient coordinators to try to continue to reach out to her and establish care for her.  Patient ultimately left AGAINST MEDICAL ADVICE.   Cheryll Cockayne, MD 04/28/20 [phone]

## 2020-04-28 NOTE — ED Notes (Signed)
Patient still insist on leaving.  She stated that  She rather go to another hospital or place.  Encouraged her to call her PCP ASAP.  She nodded.  She is still on the her phone while I was talking to her.

## 2020-04-28 NOTE — ED Notes (Signed)
Secretary called [facility] that Mary Hurley is leaving AMA.

## 2020-08-17 ENCOUNTER — Emergency Department (HOSPITAL_BASED_OUTPATIENT_CLINIC_OR_DEPARTMENT_OTHER)
Admission: EM | Admit: 2020-08-17 | Discharge: 2020-08-18 | Disposition: A | Discharge status: Home / Self Care | Payer: Medicaid Other | Attending: Emergency Medicine | Admitting: Emergency Medicine

## 2020-08-17 ENCOUNTER — Encounter (HOSPITAL_BASED_OUTPATIENT_CLINIC_OR_DEPARTMENT_OTHER): Payer: Self-pay

## 2020-08-17 ENCOUNTER — Other Ambulatory Visit: Payer: Self-pay

## 2020-08-17 DIAGNOSIS — L720 Epidermal cyst: Secondary | ICD-10-CM | POA: Diagnosis present

## 2020-08-17 DIAGNOSIS — Z5321 Procedure and treatment not carried out due to patient leaving prior to being seen by health care provider: Secondary | ICD-10-CM | POA: Diagnosis not present

## 2020-08-17 HISTORY — DX: Type 2 diabetes mellitus without complications: E11.9

## 2020-08-17 NOTE — ED Notes (Signed)
States developed cyst on mid chest noticed 2 to 3 days ago  States  it is growing in size,  Denies drainage.  States has had no fevers.

## 2020-08-17 NOTE — ED Triage Notes (Signed)
Pt c/o "a knot to my chest" x 2 days-pain worse to touch-NAD-steady gait

## 2020-08-18 NOTE — ED Notes (Signed)
Nurse went to updated pt about delay and provider coming to see pt shortly. Pt was not in room at this time. Charge and provider aware.

## 2021-09-02 ENCOUNTER — Other Ambulatory Visit: Payer: Self-pay

## 2021-09-02 ENCOUNTER — Encounter (HOSPITAL_BASED_OUTPATIENT_CLINIC_OR_DEPARTMENT_OTHER): Payer: Self-pay | Admitting: Emergency Medicine

## 2021-09-02 ENCOUNTER — Emergency Department (HOSPITAL_BASED_OUTPATIENT_CLINIC_OR_DEPARTMENT_OTHER)
Admission: EM | Admit: 2021-09-02 | Discharge: 2021-09-02 | Disposition: A | Discharge status: Home / Self Care | Payer: Medicaid Other | Attending: Emergency Medicine | Admitting: Emergency Medicine

## 2021-09-02 ENCOUNTER — Emergency Department (HOSPITAL_BASED_OUTPATIENT_CLINIC_OR_DEPARTMENT_OTHER): Payer: Medicaid Other

## 2021-09-02 DIAGNOSIS — E119 Type 2 diabetes mellitus without complications: Secondary | ICD-10-CM | POA: Insufficient documentation

## 2021-09-02 DIAGNOSIS — M25551 Pain in right hip: Secondary | ICD-10-CM | POA: Diagnosis not present

## 2021-09-02 MED ORDER — DICLOFENAC SODIUM 50 MG PO TBEC: 50.0000 mg | DELAYED_RELEASE_TABLET | Freq: Two times a day (BID) | ORAL | 0 refills | Status: AC

## 2021-09-02 MED ORDER — KETOROLAC TROMETHAMINE 15 MG/ML IJ SOLN
15.0000 mg | Freq: Once | INTRAMUSCULAR | Status: AC
  Administered 2021-09-02: 15 mg via INTRAMUSCULAR
  Filled 2021-09-02: qty 1

## 2021-09-02 MED ORDER — METHOCARBAMOL 500 MG PO TABS: 500.0000 mg | ORAL_TABLET | Freq: Two times a day (BID) | ORAL | 0 refills | Status: DC

## 2021-09-02 NOTE — ED Notes (Signed)
Patient states that she had a tubal ligation . States that she is currently on her menstrual cycle.

## 2021-09-02 NOTE — Discharge Instructions (Signed)
Take Robaxin and diclofenac as prescribed for pain.  Do not drive or operate machinery while taking Robaxin.  Follow-up with orthopedics if not improving.

## 2021-09-02 NOTE — ED Triage Notes (Signed)
Patient states that she woke up Thursday and he rt hip was hurting. Patient states that she can bear wt on the leg but it hurts . Leg has full range of motion. Patient denies any injury to leg

## 2021-09-02 NOTE — ED Provider Notes (Signed)
[facility] EMERGENCY DEPARTMENT Provider Note   CSN: [id number] Arrival date & time: 09/02/21  1002     History  Chief Complaint  Patient presents with   Hip Pain    Cristiana Yochim is a 31 y.o. female.  31 year old female presents with complaint of pain in her right hip onset Thursday morning upon waking without falls or injuries or identifiable cause.  Took Tylenol without relief.  Pain is worse with bearing weight and movement, located anterior hip.  History of diabetes.      Home Medications Prior to Admission medications   Medication Sig Start Date End Date Taking? Authorizing Provider  acetaminophen (TYLENOL) 325 MG tablet Take 650 mg by mouth every 6 (six) hours as needed.    [provider]  naproxen (NAPROSYN) 375 MG tablet Take 1 tablet twice daily as needed for pain. 11/07/18   Molpus, Jonny Ruiz, MD  naproxen (NAPROSYN) 500 MG tablet Take 1 tablet (500 mg total) by mouth 2 (two) times daily with a meal. 12/06/18   Palumbo, April, MD      Allergies    Patient has no known allergies.    Review of Systems   Review of Systems Negative except as per HPI Physical Exam Updated Vital Signs BP 120/84 (BP Location: Right Arm)   Pulse 99   Temp 98.2 F (36.8 C) (Oral)   Resp 16   Ht 5\' 2"  (1.575 m)   Wt 82.6 kg   LMP 08/30/2021 Comment: signed wavier  SpO2 100%   BMI 33.29 kg/m  Physical Exam Vitals and nursing note reviewed.  Constitutional:      General: She is not in acute distress.    Appearance: She is well-developed. She is not diaphoretic.  HENT:     Head: Normocephalic and atraumatic.  Cardiovascular:     Pulses: Normal pulses.  Pulmonary:     Effort: Pulmonary effort is normal.  Abdominal:     Palpations: Abdomen is soft.     Tenderness: There is no abdominal tenderness.  Musculoskeletal:        General: Tenderness present. No swelling, deformity or signs of injury.       Legs:     Comments: Anterior right hip/no groin pain, worse  with palpation and range of motion in any plane.  Abdomen is soft and nontender, no skin changes, no obvious abscess, no rash or lesions noted.  Also has mild tenderness right SI.  Skin:    General: Skin is warm and dry.     Findings: No bruising, erythema or rash.  Neurological:     Mental Status: She is alert and oriented to person, place, and time.     Sensory: No sensory deficit.     Motor: No weakness.  Psychiatric:        Behavior: Behavior normal.    ED Results / Procedures / Treatments   Labs (all labs ordered are listed, but only abnormal results are displayed) Labs Reviewed - No data to display  EKG None  Radiology DG Hip Unilat With Pelvis 2-3 Views Right  Result Date: 09/02/2021 CLINICAL DATA:  Pain right hip EXAM: DG HIP (WITH OR WITHOUT PELVIS) 2-3V RIGHT COMPARISON:  None Available. FINDINGS: No fracture or dislocation is seen. There is no significant joint space narrowing. There is 2 mm smooth marginated calcification adjacent to lateral margin right acetabulum, possibly residual from previous ligament injury. IMPRESSION: No recent fracture or dislocation is seen. 2 mm smooth marginated calcification adjacent to  the lateral aspect of right acetabulum may suggest soft tissue calcification from previous injury. Electronically Signed   By: Ernie Avena M.D.   On: 09/02/2021 11:23    Procedures Procedures    Medications Ordered in ED Medications  ketorolac (TORADOL) 15 MG/ML injection 15 mg (15 mg Intramuscular Given 09/02/21 1037)    ED Course/ Medical Decision Making/ A&P                           Medical Decision Making  31 year old female presents with complaint of anterior right hip pain without injury or fall x3 days.  On exam, has tenderness along right anterior hip, groin area, worse with range of motion in any plane.  There are no skin changes, no swelling.  Patient is afebrile.  Patient was given Toradol for her pain.  X-ray obtained, as interpreted  by me as negative for acute bony injury.  Agree with radiologist interpretation. Plan is to discharge with muscle relaxant and anti-inflammatory with recheck with orthopedics if not improving.        Final Clinical Impression(s) / ED Diagnoses Final diagnoses:  Right hip pain    Rx / DC Orders ED Discharge Orders     None         Jeannie Fend, PA-C 09/02/21 1135    Virgina Norfolk, DO 09/02/21 1204

## 2021-12-07 IMAGING — DX DG HAND COMPLETE 3+V*R*
3 series · 3 of 3 positions shown · non-contrast
Comparison: None.

CLINICAL DATA: RIGHT hand injury. Pain at the first
metacarpophalangeal joint.

EXAM:
RIGHT HAND - COMPLETE 3+ VIEW

[hand pa]
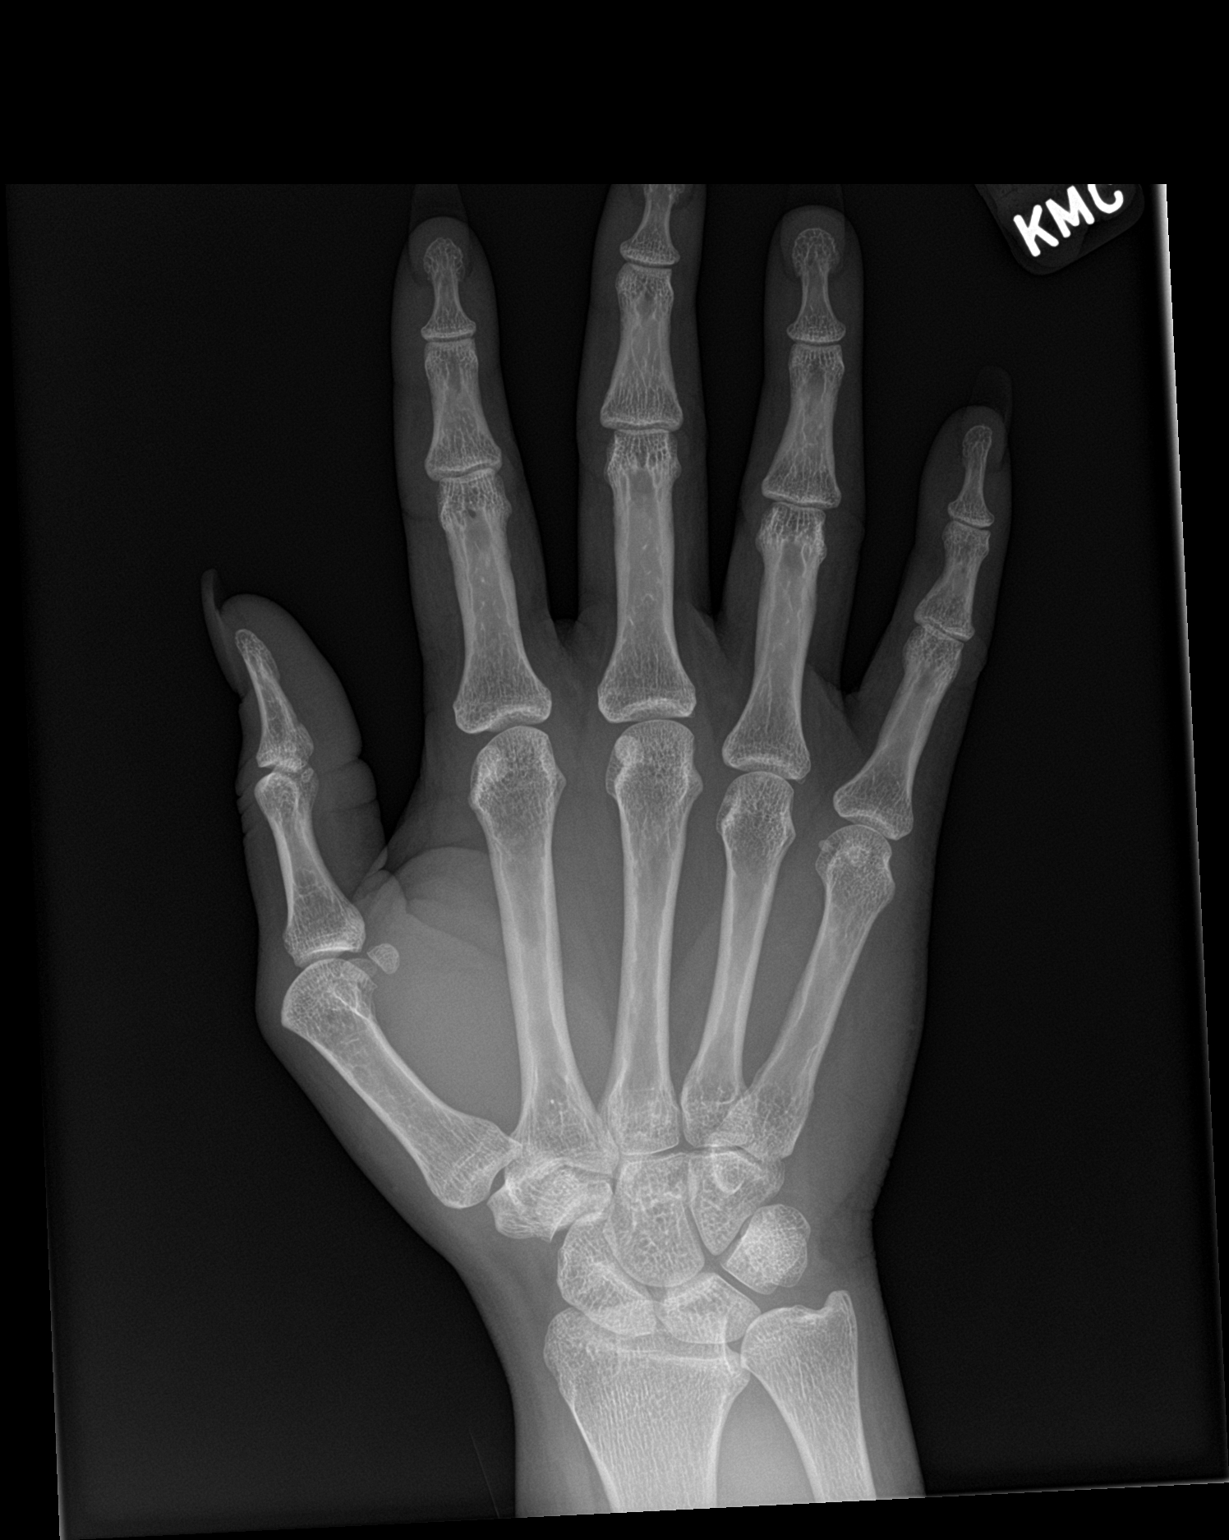

[hand obl]
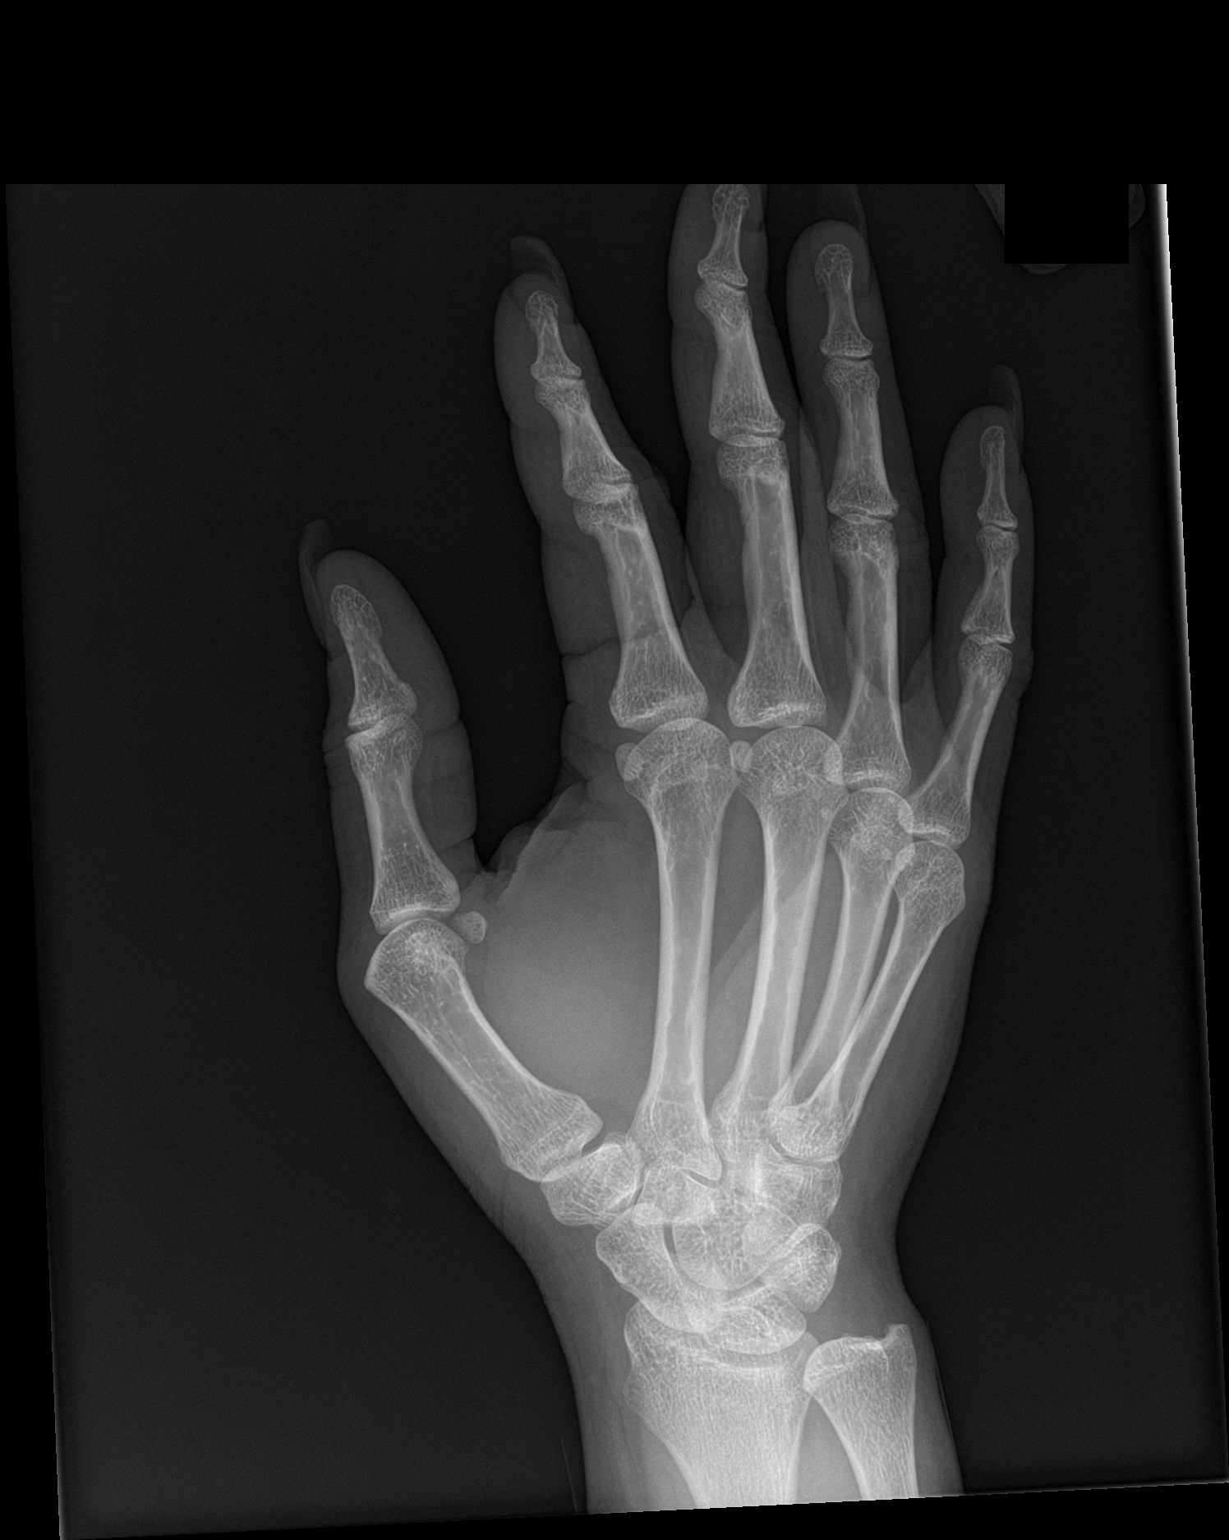

[hand lat]
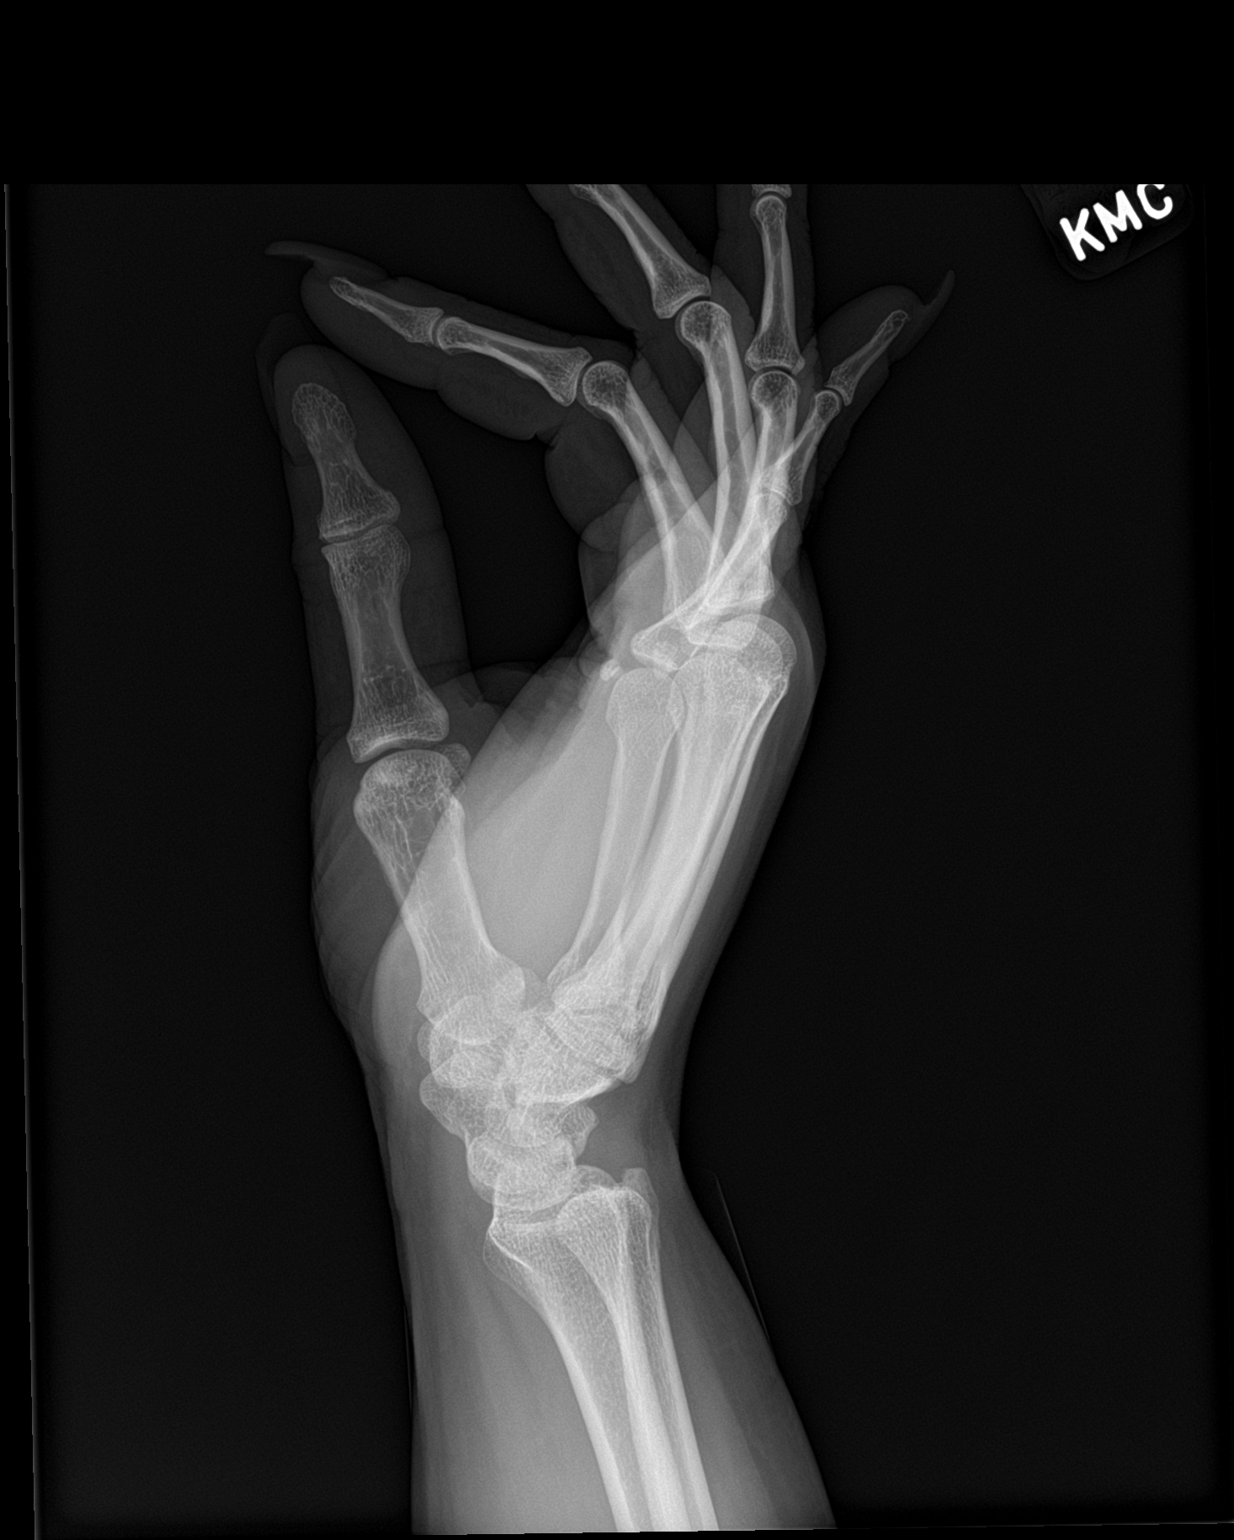

[3 of 3 positions shown; findings below may reference images not displayed]

FINDINGS: There is no evidence of fracture or dislocation. There is no
evidence of arthropathy or other focal bone abnormality. Soft
tissues are unremarkable.
IMPRESSION: Negative.

## 2022-03-04 ENCOUNTER — Emergency Department (HOSPITAL_BASED_OUTPATIENT_CLINIC_OR_DEPARTMENT_OTHER): Payer: Medicaid Other

## 2022-03-04 ENCOUNTER — Emergency Department (HOSPITAL_BASED_OUTPATIENT_CLINIC_OR_DEPARTMENT_OTHER)
Admission: EM | Admit: 2022-03-04 | Discharge: 2022-03-04 | Disposition: A | Discharge status: Home / Self Care | Payer: Medicaid Other | Attending: Emergency Medicine | Admitting: Emergency Medicine

## 2022-03-04 ENCOUNTER — Other Ambulatory Visit: Payer: Self-pay

## 2022-03-04 DIAGNOSIS — S93402A Sprain of unspecified ligament of left ankle, initial encounter: Secondary | ICD-10-CM | POA: Insufficient documentation

## 2022-03-04 DIAGNOSIS — X58XXXA Exposure to other specified factors, initial encounter: Secondary | ICD-10-CM | POA: Diagnosis not present

## 2022-03-04 DIAGNOSIS — M25572 Pain in left ankle and joints of left foot: Secondary | ICD-10-CM | POA: Diagnosis present

## 2022-03-04 NOTE — ED Triage Notes (Signed)
Patient presents to ED via POV from work. Here with left posterior ankle pain. Denies recent injury or trauma.

## 2022-03-04 NOTE — Discharge Instructions (Addendum)
It was a pleasure taking care of you!   Your x-ray was negative for fracture or dislocation.  You may take over the counter 600 mg Ibuprofen every 6 hours and alternate with 500 mg Tylenol every 6 hours as needed for pain for no more than 7 days. You will be given a post op shoe today. Wear it during the day, you may remove it at night. You may apply ice or heat to affected area for up to 15 minutes at a time. Ensure to place a barrier between your skin and the ice/heat.  Attached is information for on-call sports medicine doctor, you may call and set up a follow-up appointment regarding today's ED visit.  You may follow-up with your primary care provider as needed.  Return to the Emergency Department if you are experiencing increasing/worsening pain, swelling, color change, fever, or worsening symptoms.

## 2022-03-04 NOTE — ED Notes (Signed)
In to apply post op shoes and pt states I had tennis shoes on today why are you going to put a shoe on when I had a shoe on earlier and it hurt. Explained to pt purpose of post op shoe to help support foot and ankle. Pt refused shoe. EDP made aware and in to speak with pt.

## 2022-03-04 NOTE — ED Notes (Signed)
Pt refused vital signs at time of discharge.

## 2022-03-04 NOTE — ED Provider Notes (Signed)
[facility] EMERGENCY DEPARTMENT Provider Note   CSN: [id number] Arrival date & time: 03/04/22  1719     History  Chief Complaint  Patient presents with  . Ankle Pain    Mary Hurley is a 31 y.o. female who presents emergency department with concerns for left ankle pain onset prior to arrival.  Denies recent injury or trauma.  Patient notes that she went to place an item on the shelf when she felt a sharp pain to her left ankle.  Tried Tylenol without relief of her symptoms.  Denies swelling, redness.  The history is provided by the patient. No language interpreter was used.       Home Medications Prior to Admission medications   Medication Sig Start Date End Date Taking? Authorizing Provider  acetaminophen (TYLENOL) 325 MG tablet Take 650 mg by mouth every 6 (six) hours as needed.    [provider]  methocarbamol (ROBAXIN) 500 MG tablet Take 1 tablet (500 mg total) by mouth 2 (two) times daily. 09/02/21   Jeannie Fend, PA-C  naproxen (NAPROSYN) 375 MG tablet Take 1 tablet twice daily as needed for pain. 11/07/18   Molpus, Jonny Ruiz, MD  naproxen (NAPROSYN) 500 MG tablet Take 1 tablet (500 mg total) by mouth 2 (two) times daily with a meal. 12/06/18   Palumbo, April, MD      Allergies    Patient has no known allergies.    Review of Systems   Review of Systems  All other systems reviewed and are negative.   Physical Exam Updated Vital Signs BP (!) 129/91 (BP Location: Right Arm)   Pulse 99   Temp 97.9 F (36.6 C) (Oral)   Resp 18   Ht 5\' 3"  (1.6 m)   Wt 81.6 kg   SpO2 100%   BMI 31.89 kg/m  Physical Exam Vitals and nursing note reviewed.  Constitutional:      General: She is not in acute distress.    Appearance: Normal appearance.  Eyes:     General: No scleral icterus.    Extraocular Movements: Extraocular movements intact.  Cardiovascular:     Rate and Rhythm: Normal rate.  Pulmonary:     Effort: Pulmonary effort is normal. No  respiratory distress.  Musculoskeletal:     Cervical back: Neck supple.     Comments: Tenderness to palpation to posterior aspect of left ankle and medial malleolus without overlying skin changes.  Patient able to flex and extend against resistance without difficulty.  No tenderness to palpation noted to plantar aspect.  Negative Thompson sign.  Skin:    General: Skin is warm and dry.     Findings: No bruising, erythema or rash.  Neurological:     Mental Status: She is alert.  Psychiatric:        Behavior: Behavior normal.     ED Results / Procedures / Treatments   Labs (all labs ordered are listed, but only abnormal results are displayed) Labs Reviewed - No data to display  EKG None  Radiology DG Ankle Complete Left  Result Date: 03/04/2022 CLINICAL DATA:  Left posterior ankle pain.  No known injury. EXAM: LEFT ANKLE COMPLETE - 3 VIEW COMPARISON:  None Available. FINDINGS: There are no findings of fracture or dislocation. No joint effusion. There is no evidence of arthropathy or other focal bone abnormality. Ankle mortise is intact. Soft tissues are unremarkable. IMPRESSION: Normal radiographs of the left ankle Electronically Signed   By: 03/06/2022.D.  On: 03/04/2022 18:22    Procedures Procedures    Medications Ordered in ED Medications - No data to display  ED Course/ Medical Decision Making/ A&P Clinical Course as of 03/04/22 2204  Mon Mar 04, 2022  2026 Notified by RN that patient didn't want the post-op shoe provided to her. Pt noted that she wore a brace for 5 hours today and was noted to still have pain. Discussed with RN that patient can utilize an ace wrap at home and follow up with sports medicine as needed.  [SB]  2045 In depth conversation held with patient and significant other at bedside regarding discharge treatment plan.  Patient notes that she was having pain with the postop shoe.  Patient also noted that she was upset with how the nurse handled the postop  shoe and the pain that patient was dealing with with the postop shoe.  Offered patient cam walker boot, Aircast, crutches, Ace wrap.  Patient declines at this time.  Patient notes that she was wearing Ace wrap for 5 hours today and was still having pain.  Patient then notes "if I still have pain on going to come back because ongoing to be in pain".  Discussed with patient that is important that she follows up with her sports orthopedist.  Patient able to ambulate with antalgic gait at discharge. [SB]    Clinical Course User Index [SB] Amylynn Fano A, PA-C                           Medical Decision Making Amount and/or Complexity of Data Reviewed Radiology: ordered.   Patient with left ankle pain onset prior to arrival.  Patient notes that she went to reach something on a shelf when she felt pain to her left ankle.  No injury or trauma.  Patient afebrile.  On exam patient with tenderness to palpation noted to posterior aspect of left ankle, medial malleolus without overlying skin changes.  Patient able to flex and extend against resistance without difficulty.  No tenderness to palpation noted to plantar aspect.  Negative Thompson sign. Differential diagnosis includes sprain, fracture, dislocation, Achilles tendon rupture.  Imaging: I ordered imaging studies including left ankle x-ray I independently visualized and interpreted imaging which showed: No fracture or dislocation I agree with the radiologist interpretation    Disposition: Presentation suspicious for sprain of left ankle.  Doubt Achilles tendon rupture, negative Thompson's test today.  Doubt fracture or dislocation at this time. After consideration of the diagnostic results and the patients response to treatment, I feel that the patient would benefit from Discharge home.  Patient provided with postop shoe today.  Provided with information for on-call sports medicine doctor for follow-up. Supportive care measures and strict return  precautions discussed with patient at bedside. Pt acknowledges and verbalizes understanding. Pt appears safe for discharge. Follow up as indicated in discharge paperwork.    This chart was dictated using voice recognition software, Dragon. Despite the best efforts of this provider to proofread and correct errors, errors may still occur which can change documentation meaning.  Final Clinical Impression(s) / ED Diagnoses Final diagnoses:  Sprain of left ankle, unspecified ligament, initial encounter    Rx / DC Orders ED Discharge Orders     None         Rhett Mutschler A, PA-C 03/04/22 2204    Ezequiel Essex, MD 03/04/22 2243

## 2022-03-04 NOTE — ED Notes (Signed)
EDP in to speak with pt and offered boot, crutches, ace wrap and post op shoe which pt declined all. Pt declined wheelchair at time of dc and ambulated out of ED with steady gait friend at pt side.

## 2022-03-04 NOTE — ED Notes (Signed)
EDP at bedside  

## 2022-11-12 ENCOUNTER — Emergency Department (HOSPITAL_BASED_OUTPATIENT_CLINIC_OR_DEPARTMENT_OTHER): Payer: Medicaid Other

## 2022-11-12 ENCOUNTER — Emergency Department (HOSPITAL_BASED_OUTPATIENT_CLINIC_OR_DEPARTMENT_OTHER)
Admission: EM | Admit: 2022-11-12 | Discharge: 2022-11-13 | Disposition: A | Discharge status: Home / Self Care | Payer: Medicaid Other | Attending: Emergency Medicine | Admitting: Emergency Medicine

## 2022-11-12 ENCOUNTER — Encounter (HOSPITAL_BASED_OUTPATIENT_CLINIC_OR_DEPARTMENT_OTHER): Payer: Self-pay | Admitting: Emergency Medicine

## 2022-11-12 ENCOUNTER — Other Ambulatory Visit: Payer: Self-pay

## 2022-11-12 DIAGNOSIS — X509XXA Other and unspecified overexertion or strenuous movements or postures, initial encounter: Secondary | ICD-10-CM | POA: Insufficient documentation

## 2022-11-12 DIAGNOSIS — S86001A Unspecified injury of right Achilles tendon, initial encounter: Secondary | ICD-10-CM | POA: Diagnosis not present

## 2022-11-12 DIAGNOSIS — Y9339 Activity, other involving climbing, rappelling and jumping off: Secondary | ICD-10-CM | POA: Diagnosis not present

## 2022-11-12 DIAGNOSIS — M25571 Pain in right ankle and joints of right foot: Secondary | ICD-10-CM | POA: Diagnosis present

## 2022-11-12 DIAGNOSIS — S86011A Strain of right Achilles tendon, initial encounter: Secondary | ICD-10-CM

## 2022-11-12 MED ORDER — IBUPROFEN 400 MG PO TABS
400.0000 mg | ORAL_TABLET | Freq: Once | ORAL | Status: AC | PRN
  Administered 2022-11-12: 400 mg via ORAL
  Filled 2022-11-12: qty 1

## 2022-11-12 NOTE — ED Triage Notes (Addendum)
Pt reports she was jumping rope when she "felt a pop" and got a shooting pain in her calf that radiates up the leg, reports numbness, cap refill & pulses WNL

## 2022-11-13 MED ORDER — OXYCODONE-ACETAMINOPHEN 5-325 MG PO TABS
1.0000 | ORAL_TABLET | Freq: Once | ORAL | Status: AC
  Administered 2022-11-13: 1 via ORAL
  Filled 2022-11-13: qty 1

## 2022-11-13 MED ORDER — HYDROCODONE-ACETAMINOPHEN 5-325 MG PO TABS
1.0000 | ORAL_TABLET | Freq: Four times a day (QID) | ORAL | 0 refills | Status: AC | PRN
Start: 2022-11-13 — End: ?

## 2022-11-13 NOTE — Discharge Instructions (Signed)
Keep the splint in place.  Do not bear weight on your right leg.  Take the pain medication as prescribed.  Use ice and elevation.  Follow-up with your orthopedic doctor for recheck as you likely need surgery on your Achilles tendon.  Return to the ED with new or worsening symptoms.

## 2022-11-13 NOTE — ED Provider Notes (Signed)
[place] EMERGENCY DEPARTMENT AT [facility] Provider Note   CSN: [id number] Arrival date & time: 11/12/22  2148     History  Chief Complaint  Patient presents with   Leg Injury    Mary Hurley is a 32 y.o. female.  Patient was jumping with a jump rope which she felt a pop in the back of her right leg.  Did not fall to the ground or hit her head.  Complains of pain to her right calf and ankle area.  Feels "numb" in her leg.  Unable to bear weight.  Denies hitting her head.  No head, neck, back, chest or abdominal pain.  No injury to the left leg.  The history is provided by the patient.       Home Medications Prior to Admission medications   Medication Sig Start Date End Date Taking? Authorizing Provider  acetaminophen (TYLENOL) 325 MG tablet Take 650 mg by mouth every 6 (six) hours as needed.    [provider]  methocarbamol (ROBAXIN) 500 MG tablet Take 1 tablet (500 mg total) by mouth 2 (two) times daily. 09/02/21   Jeannie Fend, PA-C  naproxen (NAPROSYN) 375 MG tablet Take 1 tablet twice daily as needed for pain. 11/07/18   Molpus, Jonny Ruiz, MD  naproxen (NAPROSYN) 500 MG tablet Take 1 tablet (500 mg total) by mouth 2 (two) times daily with a meal. 12/06/18   Palumbo, April, MD      Allergies    Patient has no known allergies.    Review of Systems   Review of Systems  Constitutional:  Negative for activity change, appetite change and fever.  HENT:  Negative for congestion.   Respiratory:  Negative for cough, chest tightness and shortness of breath.   Gastrointestinal:  Negative for abdominal pain, nausea and vomiting.  Musculoskeletal:  Positive for arthralgias and myalgias.  Skin:  Negative for rash.  Neurological:  Negative for dizziness, weakness and headaches.   all other systems are negative except as noted in the HPI and PMH.    Physical Exam Updated Vital Signs BP (!) 130/110 (BP Location: Right Arm)   Pulse 100   Temp 98.4 F (36.9  C)   Resp 20   Ht 5\' 3"  (1.6 m)   Wt 84.4 kg   LMP 10/29/2022 (Exact Date)   SpO2 98%   BMI 32.95 kg/m  Physical Exam Vitals and nursing note reviewed.  Constitutional:      General: She is not in acute distress.    Appearance: She is well-developed.  HENT:     Head: Normocephalic and atraumatic.     Mouth/Throat:     Pharynx: No oropharyngeal exudate.  Eyes:     Conjunctiva/sclera: Conjunctivae normal.     Pupils: Pupils are equal, round, and reactive to light.  Neck:     Comments: No meningismus. Cardiovascular:     Rate and Rhythm: Normal rate and regular rhythm.     Heart sounds: Normal heart sounds. No murmur heard. Pulmonary:     Effort: Pulmonary effort is normal. No respiratory distress.     Breath sounds: Normal breath sounds.  Abdominal:     Palpations: Abdomen is soft.     Tenderness: There is no abdominal tenderness. There is no guarding or rebound.  Musculoskeletal:        General: Swelling, tenderness, deformity and signs of injury present.     Cervical back: Normal range of motion and neck supple.  Comments: Tenderness to right calf.  Achilles tendon not functioning.  Weakness in flexion and extension of right ankle.  Thompson test is positive on the right.  No proximal fibular tenderness.  Full range of motion of knee.  Intact DP and PT pulse.  Skin:    General: Skin is warm.  Neurological:     Mental Status: She is alert and oriented to person, place, and time.     Cranial Nerves: No cranial nerve deficit.     Motor: No abnormal muscle tone.     Coordination: Coordination normal.     Comments:  5/5 strength throughout. CN 2-12 intact.Equal grip strength.   Psychiatric:        Behavior: Behavior normal.     ED Results / Procedures / Treatments   Labs (all labs ordered are listed, but only abnormal results are displayed) Labs Reviewed - No data to display  EKG None  Radiology DG Tibia/Fibula Right  Result Date: 11/12/2022 CLINICAL DATA:   Status post trauma. EXAM: RIGHT TIBIA AND FIBULA - 2 VIEW COMPARISON:  None Available. FINDINGS: There is no evidence of fracture or other focal bone lesions. Soft tissues are unremarkable. IMPRESSION: Negative. Electronically Signed   By: Aram Candela M.D.   On: 11/12/2022 22:48   DG Ankle Complete Right  Result Date: 11/12/2022 CLINICAL DATA:  Status post trauma. EXAM: RIGHT ANKLE - COMPLETE 3+ VIEW COMPARISON:  None Available. FINDINGS: There is no evidence of fracture, dislocation, or joint effusion. There is no evidence of arthropathy or other focal bone abnormality. Soft tissues are unremarkable. IMPRESSION: Negative. Electronically Signed   By: Aram Candela M.D.   On: 11/12/2022 22:47    Procedures Procedures    Medications Ordered in ED Medications  oxyCODONE-acetaminophen (PERCOCET/ROXICET) 5-325 MG per tablet 1 tablet (has no administration in time range)  ibuprofen (ADVIL) tablet 400 mg (400 mg Oral Given 11/12/22 2218)    ED Course/ Medical Decision Making/ A&P                                 Medical Decision Making Amount and/or Complexity of Data Reviewed Labs: ordered. Decision-making details documented in ED Course. Radiology: ordered and independent interpretation performed. Decision-making details documented in ED Course. ECG/medicine tests: ordered and independent interpretation performed. Decision-making details documented in ED Course.  Risk Prescription drug management.   Right leg injury after jumping rope.  Neurovascular intact.  X-rays obtained in triage are negative for fracture.  Results reviewed and interpreted by me.  Clinical exam consistent with Achilles tendon rupture.  Patient splinted in plantarflexion.  Given crutches.  Discussed ice, elevation, orthopedic follow-up, nonweightbearing.  Discussed she likely needs surgery on her Achilles tendon.  Return to the ED with worsening pain, weakness, numbness, tingling or any other  concerns.        Final Clinical Impression(s) / ED Diagnoses Final diagnoses:  Rupture of right Achilles tendon, initial encounter    Rx / DC Orders ED Discharge Orders     None         Dailin Sosnowski, Jeannett Senior, MD 11/13/22 0107

## 2023-01-28 ENCOUNTER — Emergency Department (HOSPITAL_BASED_OUTPATIENT_CLINIC_OR_DEPARTMENT_OTHER)
Admission: EM | Admit: 2023-01-28 | Discharge: 2023-01-28 | Disposition: A | Discharge status: Home / Self Care | Payer: Medicaid Other | Attending: Emergency Medicine | Admitting: Emergency Medicine

## 2023-01-28 ENCOUNTER — Other Ambulatory Visit: Payer: Self-pay

## 2023-01-28 ENCOUNTER — Other Ambulatory Visit (HOSPITAL_BASED_OUTPATIENT_CLINIC_OR_DEPARTMENT_OTHER): Payer: Self-pay

## 2023-01-28 ENCOUNTER — Encounter (HOSPITAL_BASED_OUTPATIENT_CLINIC_OR_DEPARTMENT_OTHER): Payer: Self-pay | Admitting: Urology

## 2023-01-28 DIAGNOSIS — M62830 Muscle spasm of back: Secondary | ICD-10-CM | POA: Insufficient documentation

## 2023-01-28 DIAGNOSIS — Y92410 Unspecified street and highway as the place of occurrence of the external cause: Secondary | ICD-10-CM | POA: Insufficient documentation

## 2023-01-28 DIAGNOSIS — M62838 Other muscle spasm: Secondary | ICD-10-CM

## 2023-01-28 DIAGNOSIS — M545 Low back pain, unspecified: Secondary | ICD-10-CM | POA: Diagnosis not present

## 2023-01-28 DIAGNOSIS — M546 Pain in thoracic spine: Secondary | ICD-10-CM | POA: Insufficient documentation

## 2023-01-28 MED ORDER — IBUPROFEN 800 MG PO TABS
800.0000 mg | ORAL_TABLET | Freq: Three times a day (TID) | ORAL | 0 refills | Status: AC
  Filled 2023-01-28: qty 21, 7d supply, fill #0

## 2023-01-28 MED ORDER — CYCLOBENZAPRINE HCL 10 MG PO TABS
10.0000 mg | ORAL_TABLET | Freq: Two times a day (BID) | ORAL | 0 refills | Status: AC | PRN
  Filled 2023-01-28: qty 20, 10d supply, fill #0
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
## 2023-01-28 NOTE — Discharge Instructions (Addendum)
Take Flexeril and ibuprofen as prescribed.  Flexeril is sedating so do not mix with alcohol or drugs or dangerous activities.  Follow-up with your primary care doctor or sports medicine if you are still having discomfort.
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
